# Patient Record
Sex: Female | Born: 1980 | Race: White | Hispanic: No | Marital: Married | Smoking: Former smoker
Health system: Southern US, Community
[De-identification: ages and names within clinical notes are randomized; demographics above are authoritative.]

## PROBLEM LIST (undated history)

## (undated) DIAGNOSIS — F419 Anxiety disorder, unspecified: Secondary | ICD-10-CM

## (undated) DIAGNOSIS — Z973 Presence of spectacles and contact lenses: Secondary | ICD-10-CM

## (undated) DIAGNOSIS — O039 Complete or unspecified spontaneous abortion without complication: Secondary | ICD-10-CM

## (undated) DIAGNOSIS — R350 Frequency of micturition: Secondary | ICD-10-CM

## (undated) DIAGNOSIS — E282 Polycystic ovarian syndrome: Secondary | ICD-10-CM

## (undated) DIAGNOSIS — K219 Gastro-esophageal reflux disease without esophagitis: Secondary | ICD-10-CM

## (undated) DIAGNOSIS — F17200 Nicotine dependence, unspecified, uncomplicated: Secondary | ICD-10-CM

## (undated) DIAGNOSIS — F329 Major depressive disorder, single episode, unspecified: Secondary | ICD-10-CM

## (undated) DIAGNOSIS — D259 Leiomyoma of uterus, unspecified: Secondary | ICD-10-CM

## (undated) DIAGNOSIS — E039 Hypothyroidism, unspecified: Secondary | ICD-10-CM

## (undated) DIAGNOSIS — E669 Obesity, unspecified: Secondary | ICD-10-CM

## (undated) DIAGNOSIS — O24419 Gestational diabetes mellitus in pregnancy, unspecified control: Secondary | ICD-10-CM

## (undated) DIAGNOSIS — G8929 Other chronic pain: Secondary | ICD-10-CM

## (undated) DIAGNOSIS — M542 Cervicalgia: Secondary | ICD-10-CM

## (undated) DIAGNOSIS — F411 Generalized anxiety disorder: Secondary | ICD-10-CM

## (undated) DIAGNOSIS — Z8742 Personal history of other diseases of the female genital tract: Secondary | ICD-10-CM

## (undated) DIAGNOSIS — R002 Palpitations: Secondary | ICD-10-CM

## (undated) DIAGNOSIS — N979 Female infertility, unspecified: Secondary | ICD-10-CM

## (undated) DIAGNOSIS — G44229 Chronic tension-type headache, not intractable: Secondary | ICD-10-CM

## (undated) HISTORY — DX: Polycystic ovarian syndrome: E28.2

## (undated) HISTORY — DX: Major depressive disorder, single episode, unspecified: F32.9

## (undated) HISTORY — DX: Cervicalgia: M54.2

## (undated) HISTORY — DX: Nicotine dependence, unspecified, uncomplicated: F17.200

## (undated) HISTORY — DX: Other chronic pain: G89.29

## (undated) HISTORY — DX: Anxiety disorder, unspecified: F41.9

## (undated) HISTORY — DX: Gastro-esophageal reflux disease without esophagitis: K21.9

## (undated) HISTORY — DX: Complete or unspecified spontaneous abortion without complication: O03.9

## (undated) HISTORY — DX: Obesity, unspecified: E66.9

## (undated) HISTORY — DX: Palpitations: R00.2

## (undated) HISTORY — DX: Personal history of other diseases of the female genital tract: Z87.42

## (undated) HISTORY — DX: Chronic tension-type headache, not intractable: G44.229

## (undated) HISTORY — PX: OVARIAN CYST REMOVAL: SHX89

---

## 2011-11-01 ENCOUNTER — Encounter: Payer: Self-pay | Admitting: Family Medicine

## 2011-11-01 ENCOUNTER — Ambulatory Visit (INDEPENDENT_AMBULATORY_CARE_PROVIDER_SITE_OTHER): Payer: BC Managed Care – PPO | Admitting: Family Medicine

## 2011-11-01 VITALS — BP 109/71 | HR 71 | Temp 97.2°F | Ht 67.0 in | Wt 293.0 lb

## 2011-11-01 DIAGNOSIS — T148XXA Other injury of unspecified body region, initial encounter: Secondary | ICD-10-CM | POA: Insufficient documentation

## 2011-11-01 DIAGNOSIS — IMO0002 Reserved for concepts with insufficient information to code with codable children: Secondary | ICD-10-CM

## 2011-11-01 DIAGNOSIS — S8390XA Sprain of unspecified site of unspecified knee, initial encounter: Secondary | ICD-10-CM | POA: Insufficient documentation

## 2011-11-01 MED ORDER — HYDROCODONE-ACETAMINOPHEN 5-500 MG PO TABS: 1.0000 | ORAL_TABLET | Freq: Four times a day (QID) | ORAL | Status: AC | PRN

## 2011-11-01 NOTE — Progress Notes (Signed)
Office Note 11/01/2011  CC:  Chief Complaint  Patient presents with  . Establish Care    knee pain, injured at beach on 7/24, using mom's pain med ?hydrocodone    HPI:  Carrie Blanchard is a 31 y.o. White female who is here to establish care and discuss left knee problem. Patient's most recent primary MD: Dr. Dewain Penning. Old records were not reviewed prior to or during today's visit.  Patient was at the beach 5 d/a, felt leg cramp as she got up out of beach lounger, fell and hit her leg on the chair. Felt severe pain in left leg but could bear wt initially, limped.  Then it felt much better and she was able to walk some and went to get in the hot-tub.  Pretty soon she began to feel worsening pain in left knee and it swelled some.  Some bruising was noted below knee.  She applied ice, elevated the leg, has taken some ibuprofen and/or alleve, and then when she got home from the beach 2 d/a she began taking some of her mom's hydrocodone.  The knee is no longer swollen, she rates the pain as 6/10 w/out hydrocodone, and it comes down to 3/10 with hydrocodone.  Walks with a limp. No prior hx of any knee problems.  Past Medical History  Diagnosis Date  . Polycystic ovarian syndrome   . Anxiety   . Tobacco dependence   . Obesity, Class II, BMI 35-39.9     Past Surgical History  Procedure Date  . Ovarian cyst removal Age 89 yrs    Large  (Grapefruit size per pt)    Family History  Problem Relation Age of Onset  . Arthritis Mother     "rheumatoid" per pt report  . Diabetes Father   . Cancer Maternal Grandmother     breast    History   Social History  . Marital Status: Married    Spouse Name: N/A    Number of Children: N/A  . Years of Education: N/A   Occupational History  . Not on file.   Social History Main Topics  . Smoking status: Current Everyday Smoker -- 0.5 packs/day    Types: Cigarettes  . Smokeless tobacco: Never Used  . Alcohol Use: Yes  . Drug Use: No    . Sexually Active: Not on file   Other Topics Concern  . 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  Occasional alcohol use.  No drug use.No exercise.    Outpatient Encounter Prescriptions as of 11/01/2011  Medication Sig Dispense Refill  . busPIRone (BUSPAR) 15 MG tablet Take 7.5 mg by mouth 2 (two) times daily.      . clonazePAM (KLONOPIN) 1 MG tablet Take 1 mg by mouth 2 (two) times daily as needed.      Marland Kitchen HYDROcodone-acetaminophen (VICODIN) 5-500 MG per tablet Take 1 tablet by mouth every 6 (six) hours as needed for pain. 1-2 tabs q6h prn pain  30 tablet  0  . metFORMIN (GLUCOPHAGE) 500 MG tablet Take 500 mg by mouth 2 (two) times daily with a meal.      . spironolactone (ALDACTONE) 50 MG tablet Take 50 mg by mouth daily.        No Known Allergies  ROS Review of Systems  Constitutional: Negative for fever and fatigue.  HENT: Negative for congestion and sore throat.   Eyes: Negative for visual disturbance.  Respiratory: Negative for cough.   Cardiovascular: Negative for chest pain.  Gastrointestinal: Negative for nausea and abdominal pain.  Genitourinary: Negative for dysuria.  Musculoskeletal: Negative for myalgias and back pain.  Skin: Negative for rash.  Neurological: Negative for weakness and headaches.  Hematological: Negative for adenopathy.    PE; Blood pressure 109/71, pulse 71, temperature 97.2 F (36.2 C), temperature source Temporal, height 5\' 7"  (1.702 m), weight 293 lb (132.904 kg), last menstrual period 10/02/2011, SpO2 99.00%. Gen: Alert, well appearing.  Patient is oriented to person, place, time, and situation. ENT: Eyes: no injection, icteris, swelling, or exudate.  EOMI, PERRLA. Nose: no drainage or turbinate edema/swelling.  No injection or focal lesion.  Mouth: lips without  lesion/swelling.  Oral mucosa pink and moist.  Dentition intact and without obvious caries or gingival swelling.  Oropharynx without erythema, exudate, or swelling.  Neck - No masses or thyromegaly or limitation in range of motion CV: RRR, no m/r/g.   LUNGS: CTA bilat, nonlabored resps, good aeration in all lung fields. EXT: no c/c/e.  No calf or thigh tenderness. Left leg has small bruise visible on anterior tibia--proximal, just beyond the insertion of the patellar tendon. No knee effusion, warmth, or erythema.  She can extend the knee fully, but with passive flexion she can only get to 90 degrees due to pain.  Lachman's negative.  Posterior drawer negative.  Mild discomfort in both the medial and lateral collateral ligament areas but no laxity detected.  No patellar or joint line tenderness.  No tenderness of the patellar tendon or the distal quad region.  She has moderate tenderness just distal to the insertion of the patellar tendon on the tibia, where the bruising is noted.  Pertinent labs:  none  ASSESSMENT AND PLAN:   New pt: obtain old records.  Knee sprain Improving over the last 5d. Continue RICE therapy and I did give her a elastic knee sleeve today to wear as much as possible for stability. She may take vicodin 5/500 in addition to her NSAID, 1-2 tabs q6h prn, #30, no RF.  Contusion Anterior tibia.  I think this may be giving her more pain than her knee sprain. It is improving.  I reassured her, told her to continue RICE therapy, take vicodin as noted in problem #1. F/u 1 wk.    Return in about 1 week (around 11/08/2011) for f/u left knee sprain and tibial contusions.

## 2011-11-01 NOTE — Assessment & Plan Note (Signed)
Improving over the last 5d. Continue RICE therapy and I did give her a elastic knee sleeve today to wear as much as possible for stability. She may take vicodin 5/500 in addition to her NSAID, 1-2 tabs q6h prn, #30, no RF.

## 2011-11-01 NOTE — Patient Instructions (Signed)
Continue to take ibuprofen or alleve, 2 tabs every 6 hours with food.

## 2011-11-01 NOTE — Assessment & Plan Note (Signed)
Anterior tibia.  I think this may be giving her more pain than her knee sprain. It is improving.  I reassured her, told her to continue RICE therapy, take vicodin as noted in problem #1. F/u 1 wk.

## 2011-11-04 ENCOUNTER — Telehealth: Payer: Self-pay | Admitting: Family Medicine

## 2011-11-04 NOTE — Telephone Encounter (Signed)
I have attempted to contact this patient by phone with the following results: no answer after 10 rings.   I will try again later.

## 2011-11-04 NOTE — Telephone Encounter (Signed)
2nd call from patient.  She has been unreachable and will be unreachable again soon.  Pt is requesting phenergan for nausea due to hydrocodone.  Please advise.

## 2011-11-08 ENCOUNTER — Ambulatory Visit: Payer: BC Managed Care – PPO | Admitting: Family Medicine

## 2012-01-11 ENCOUNTER — Ambulatory Visit (INDEPENDENT_AMBULATORY_CARE_PROVIDER_SITE_OTHER): Payer: BC Managed Care – PPO | Admitting: Family Medicine

## 2012-01-11 VITALS — BP 111/68 | HR 82 | Temp 98.6°F | Resp 20 | Ht 66.5 in | Wt 269.0 lb

## 2012-01-11 DIAGNOSIS — R238 Other skin changes: Secondary | ICD-10-CM | POA: Insufficient documentation

## 2012-01-11 DIAGNOSIS — R002 Palpitations: Secondary | ICD-10-CM | POA: Insufficient documentation

## 2012-01-11 DIAGNOSIS — Z8679 Personal history of other diseases of the circulatory system: Secondary | ICD-10-CM

## 2012-01-11 HISTORY — DX: Personal history of other diseases of the circulatory system: Z86.79

## 2012-01-11 HISTORY — DX: Palpitations: R00.2

## 2012-01-11 MED ORDER — MUPIROCIN CALCIUM 2 % EX CREA: TOPICAL_CREAM | Freq: Three times a day (TID) | CUTANEOUS | Status: DC

## 2012-01-11 NOTE — Progress Notes (Signed)
Carrie Blanchard is a 31 y.o. female who presents to [facility] today for  1) palpitations occurring over the past few days. Appear to be intermittent independent of anxiety or activity. She feels that her heart is fluttering. She denies any chest pains or palpitations. She has not tried any new medications recently. She feels well otherwise. Drinks one cup of coffee in the morning.   2) patient notes crusty blood from her belly button started yesterday. She denies any trauma or recent surgery to the area.  She denies any pain fevers chills sweats or weight loss.    PMH: Reviewed significant for obesity and anxiety and polycystic ovarian syndrome History  Substance Use Topics  . Smoking status: Current Every Day Smoker -- 0.5 packs/day    Types: Cigarettes  . Smokeless tobacco: Never Used  . Alcohol Use: Yes   ROS as above  Medications reviewed. Current Outpatient Prescriptions  Medication Sig Dispense Refill  . busPIRone (BUSPAR) 15 MG tablet Take 7.5 mg by mouth 2 (two) times daily.      . clonazePAM (KLONOPIN) 1 MG tablet Take 1 mg by mouth 2 (two) times daily as needed.      . metFORMIN (GLUCOPHAGE) 500 MG tablet Take 500 mg by mouth 2 (two) times daily with a meal.      . spironolactone (ALDACTONE) 50 MG tablet Take 50 mg by mouth daily.        Exam:  BP 111/68  Pulse 82  Temp 98.6 F (37 C) (Oral)  Resp 20  Ht 5' 6.5" (1.689 m)  Wt 269 lb (122.018 kg)  BMI 42.77 kg/m2  SpO2 99%  LMP 01/01/2012 Gen: Well NAD HEENT: EOMI,  MMM Lungs: CTABL Nl WOB Heart: RRR no MRG Abd: NABS, NT, ND Exts: Non edematous BL  LE, warm and well perfused.  Skin: Erythematous appearing umbilicus nontender. Small amount of serous sanguinous fluid on the probe of umbilicus with Q-tip.  Nontender.  No surrounding erythema.    EKG:  Normal sinus rhythm:  Normal rate and rhythm.  No ST segment changes Normal EKG  No results found for this or any previous visit (from the past 72 hour(s)).  Assessment  and Plan: 31 y.o. female with  1) palpitations: Likely related to anxiety versus PACs with caffeine.   Plan: Stop caffeine. Basic metabolic panel and TSH Follow up if not improved in a week or 2.  Would obtain Holter monitor at that point 2) umbilicus: Due to skin irritation secondary to body habitus. Plan: Bactroban ointment.  Follow up if worsening  Discussed warning signs or symptoms. Please see discharge instructions. Patient expresses understanding.

## 2012-01-11 NOTE — Patient Instructions (Addendum)
Thank you for coming in today. Apply the cream to your belly button twice a day.  Cut back on caffeine.  Come back when you get back if you do not get better Call or go to the emergency room if you get worse, have trouble breathing, have chest pains, or palpitations.

## 2012-01-12 ENCOUNTER — Other Ambulatory Visit: Payer: Self-pay | Admitting: Family Medicine

## 2012-01-12 LAB — BASIC METABOLIC PANEL
CO2: 29 mEq/L (ref 19–32)
Chloride: 102 mEq/L (ref 96–112)
Glucose, Bld: 73 mg/dL (ref 70–99)
Potassium: 4.5 mEq/L (ref 3.5–5.3)
Sodium: 140 mEq/L (ref 135–145)

## 2012-01-12 NOTE — Progress Notes (Signed)
patient discussed with Dr. Corey. Agree with assessment and plan of care per his note.   

## 2012-01-12 NOTE — Telephone Encounter (Signed)
Please pull chart.

## 2012-01-12 NOTE — Telephone Encounter (Signed)
Patient is a new patient and has only been seen in Epic.  No paper chart available.

## 2012-01-14 ENCOUNTER — Encounter: Payer: Self-pay | Admitting: *Deleted

## 2012-04-17 ENCOUNTER — Ambulatory Visit (INDEPENDENT_AMBULATORY_CARE_PROVIDER_SITE_OTHER): Payer: BC Managed Care – PPO | Admitting: Family Medicine

## 2012-04-17 ENCOUNTER — Encounter: Payer: Self-pay | Admitting: Family Medicine

## 2012-04-17 VITALS — BP 116/76 | HR 67 | Temp 99.0°F | Ht 67.0 in | Wt 272.0 lb

## 2012-04-17 DIAGNOSIS — M79609 Pain in unspecified limb: Secondary | ICD-10-CM

## 2012-04-17 DIAGNOSIS — M79662 Pain in left lower leg: Secondary | ICD-10-CM | POA: Insufficient documentation

## 2012-04-17 LAB — BASIC METABOLIC PANEL
BUN: 13 mg/dL (ref 6–23)
CO2: 29 mEq/L (ref 19–32)
Chloride: 103 mEq/L (ref 96–112)
Creatinine, Ser: 0.5 mg/dL (ref 0.4–1.2)

## 2012-04-17 MED ORDER — CLONAZEPAM 1 MG PO TABS: 1.0000 mg | ORAL_TABLET | Freq: Two times a day (BID) | ORAL | Status: DC | PRN

## 2012-04-17 MED ORDER — BUSPIRONE HCL 15 MG PO TABS: 7.5000 mg | ORAL_TABLET | Freq: Two times a day (BID) | ORAL | Status: DC

## 2012-04-17 MED ORDER — SPIRONOLACTONE 50 MG PO TABS: 50.0000 mg | ORAL_TABLET | Freq: Every day | ORAL | Status: DC

## 2012-04-17 MED ORDER — METFORMIN HCL 500 MG PO TABS: 500.0000 mg | ORAL_TABLET | Freq: Two times a day (BID) | ORAL | Status: DC

## 2012-04-17 NOTE — Assessment & Plan Note (Signed)
I cannot detect any abnormality. Low risk for DVT.  Will check D-dimer and BMET today. Reassured pt. No new meds rx'd today.

## 2012-04-17 NOTE — Progress Notes (Signed)
OFFICE NOTE  04/17/2012  CC:  Chief Complaint  Patient presents with  . Leg Pain    left lower leg-upper calf began yesterday; doesn't feel muscular; no redness, swelling, not warm to touch; using ibuprofen which helps; fell last week and has bruise/scratch on shin     HPI: Patient is a 32 y.o. Caucasian female who is here for onset of left calf pain 3 d/a.  The pain comes and goes, lasts  hours when it comes.  She feels no abnormality there with her hand.  Describes injury to anterior left lower leg about 1 week ago when coming down her stairs--low impact.  This left a bruise and the pain went away and it hasn't bothered her since. Calf pain not clearly connected to any trigger such as walking or wt bearing.  She is not on OCPs. No personal history of DVT or PE.  Maternal aunt had a blood clot that pt doesn't know details of. Patient does smoke about 1/3 pack of cigarettes per day.   Pertinent PMH:  Past Medical History  Diagnosis Date  . Polycystic ovarian syndrome   . Anxiety     Sees a Psychiatrist (Dr. Kateri Plummer)  . Tobacco dependence   . Obesity, Class II, BMI 35-39.9     MEDS:  Outpatient Prescriptions Prior to Visit  Medication Sig Dispense Refill  . busPIRone (BUSPAR) 15 MG tablet Take 7.5 mg by mouth 2 (two) times daily.      . clonazePAM (KLONOPIN) 1 MG tablet Take 1 mg by mouth 2 (two) times daily as needed.      . metFORMIN (GLUCOPHAGE) 500 MG tablet Take 500 mg by mouth 2 (two) times daily with a meal.      . spironolactone (ALDACTONE) 50 MG tablet Take 50 mg by mouth daily.      . [DISCONTINUED] mupirocin cream (BACTROBAN) 2 % Apply topically 3 (three) times daily.  15 g  0   Last reviewed on 04/17/2012  1:32 PM by Luisa Dago, CMA  PE: Blood pressure 116/76, pulse 67, temperature 99 F (37.2 C), temperature source Temporal, height 5\' 7"  (1.702 m), weight 272 lb (123.378 kg), SpO2 97.00%. Gen: Alert, well appearing, obese white female in NAD.  Patient is  oriented to person, place, time, and situation. AFFECT: pleasant, lucid thought and speech. LEGS: thighs nontender.  Popliteal fossae and knees without tenderness, ROM intact. Mild TTP at the site of a healing abrasion on left anterior tibial surface.  No tenderness in the remainder of her lower leg on the left.  Right lower leg without tenderness or swelling.  No edema on either side.  She has a few scattered spider veins on both LE's.   Right calf circumference at 12 cm below the patella is 43 cm. Left calf circumference at 12 cm below the patella is 42.5 cm   IMPRESSION AND PLAN:  Pain of left lower leg I cannot detect any abnormality. Low risk for DVT.  Will check D-dimer and BMET today. Reassured pt. No new meds rx'd today.  She is overdue for f/u with her psychiatrist, Dr. Kateri Plummer, and says she plans on getting f/u set up soon.  In the meantime she ran out of buspar and clonazepam 2 wks ago per her report today.  I agreed to give her 1 mo supply of each of these meds and encouraged her to f/u with psychiatrist for ongoing med management and counseling for her anxiety.  An After Visit Summary was  printed and given to the patient.  FOLLOW UP:  33mo for routine f/u PCOS meds

## 2012-07-14 ENCOUNTER — Ambulatory Visit: Payer: BC Managed Care – PPO | Admitting: Family Medicine

## 2012-09-21 ENCOUNTER — Telehealth: Payer: Self-pay | Admitting: Family Medicine

## 2012-09-21 NOTE — Telephone Encounter (Signed)
Pt's pharmacy sent a request for sertraline 50mg  PO QD but I can not find this medication in patients chart anywhere.  Please advise.

## 2012-09-21 NOTE — Telephone Encounter (Signed)
She was seeing a psychiatrist named Dr. Kateri Plummer, so he/she may have been prescribing this med for her.  I recommend she call her psychiatrist for RFs or make appt with me for f/u if she is no longer going to see her psychiatrist.  --thx

## 2012-09-22 NOTE — Telephone Encounter (Signed)
lmom for pt to return call. °

## 2012-09-25 NOTE — Telephone Encounter (Signed)
lmom for pt to return call. °

## 2012-10-02 ENCOUNTER — Other Ambulatory Visit: Payer: Self-pay | Admitting: Family Medicine

## 2012-10-02 NOTE — Telephone Encounter (Signed)
Got another request for sertraline.  Faxed to pharmacy that patient gets that from Dr. Kateri Plummer.

## 2013-02-02 ENCOUNTER — Ambulatory Visit (INDEPENDENT_AMBULATORY_CARE_PROVIDER_SITE_OTHER): Payer: BC Managed Care – PPO | Admitting: Family Medicine

## 2013-02-02 ENCOUNTER — Encounter: Payer: Self-pay | Admitting: Family Medicine

## 2013-02-02 VITALS — BP 119/85 | HR 86 | Temp 98.6°F | Resp 18 | Ht 67.0 in | Wt 266.0 lb

## 2013-02-02 DIAGNOSIS — E282 Polycystic ovarian syndrome: Secondary | ICD-10-CM

## 2013-02-02 DIAGNOSIS — F329 Major depressive disorder, single episode, unspecified: Secondary | ICD-10-CM

## 2013-02-02 DIAGNOSIS — F32A Depression, unspecified: Secondary | ICD-10-CM

## 2013-02-02 DIAGNOSIS — F341 Dysthymic disorder: Secondary | ICD-10-CM

## 2013-02-02 MED ORDER — METFORMIN HCL 500 MG PO TABS: 500.0000 mg | ORAL_TABLET | Freq: Two times a day (BID) | ORAL | Status: DC

## 2013-02-02 MED ORDER — SPIRONOLACTONE 50 MG PO TABS: 50.0000 mg | ORAL_TABLET | Freq: Every day | ORAL | Status: DC

## 2013-02-02 MED ORDER — CITALOPRAM HYDROBROMIDE 20 MG PO TABS: 20.0000 mg | ORAL_TABLET | Freq: Every day | ORAL | Status: DC

## 2013-02-02 NOTE — Progress Notes (Addendum)
OFFICE NOTE  02/09/2013  CC:  Chief Complaint  Patient presents with  . Medication Refill     HPI: Patient is a 32 y.o. Caucasian female who is here for discuss meds. Her psychiatrist, Dr. Arminda Resides, relocated/moved and pt has not found replacement. She is interested in me managing her meds at this point.  She was on clonazepam and buspar and ran out of these about 1 mo ago. She took clonaz 1 tab bid, buspar "2 tabs 2-3 times a day" but then says "sometimes I only took a 1/2 tab b/c of shakiness".  She split from her husband recently, he had an anger control problem. She describes anhedonia, poor concentration, flat emotion, irritable.  No restlessness.  Energy level ok.  +Uptight, "hard to calm down". "I can't relax".  Sleep is normal.  No crying spells.   Denies hx of manic sx's in the past.  Has been on antidepressant in the past; recalls wellbutrin, paxil, lexapro.  PCOS:  Has been out of metformin and aldactone for at least 2 mo.  She sees GYN MD --[facility], but last f/u the provider who saw her was "not able to RF my meds".  She does feel that her acne is coming back since being off the meds.   Pertinent PMH:  Past Medical History  Diagnosis Date  . Polycystic ovarian syndrome   . Anxiety     Saw a Psychiatrist (Dr. Kateri Plummer) but not as of summer 2014.  . Tobacco dependence   . Obesity, Class II, BMI 35-39.9   . Palpitations 01/11/2012   Past Surgical History  Procedure Laterality Date  . Ovarian cyst removal  Age 54 yrs    Large  (Grapefruit size per pt)   History   Social History Narrative   Married, no children.   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   BS/BA from [employer] in [employer].   Tobacco 5 pack-yr history.  Occasional alcohol use.  No drug use.   No exercise.    MEDS:  Outpatient Prescriptions Prior to Visit  Medication Sig Dispense Refill  . busPIRone (BUSPAR) 15 MG tablet Take 0.5 tablets  (7.5 mg total) by mouth 2 (two) times daily.  30 tablet  0  . clonazePAM (KLONOPIN) 1 MG tablet Take 1 tablet (1 mg total) by mouth 2 (two) times daily as needed.  60 tablet  0  . metFORMIN (GLUCOPHAGE) 500 MG tablet Take 1 tablet (500 mg total) by mouth 2 (two) times daily with a meal.  60 tablet  6  . spironolactone (ALDACTONE) 50 MG tablet Take 1 tablet (50 mg total) by mouth daily.  30 tablet  6   No facility-administered medications prior to visit.    PE: Blood pressure 119/85, pulse 86, temperature 98.6 F (37 C), temperature source Temporal, resp. rate 18, height 5\' 7"  (1.702 m), weight 266 lb (120.657 kg), last menstrual period 01/30/2013, SpO2 99.00%. Wt Readings from Last 2 Encounters:  02/02/13 266 lb (120.657 kg)  04/17/12 272 lb (123.378 kg)    Gen: alert, oriented x 4, affect pleasant.  Lucid thinking and conversation noted. HEENT: PERRLA, EOMI.   Neck: no LAD, mass, or thyromegaly. CV: RRR, no m/r/g LUNGS: CTA bilat, nonlabored. NEURO: no tremor or tics noted on observation.  Coordination intact. CN 2-12 grossly intact bilaterally, strength 5/5 in all extremeties.  No ataxia.  LABS: none today    Chemistry  Component Value Date/Time   NA 138 04/17/2012 1359   K 4.2 04/17/2012 1359   CL 103 04/17/2012 1359   CO2 29 04/17/2012 1359   BUN 13 04/17/2012 1359   CREATININE 0.5 04/17/2012 1359   CREATININE 0.65 01/11/2012 2051      Component Value Date/Time   CALCIUM 8.6 04/17/2012 1359     IMPRESSION AND PLAN:  Anxiety and depression Start trial of citalopram 20mg  qd. Therapeutic expectations and side effect profile of medication discussed today.  Patient's questions answered. No benzo or buspar rx'd at this time.   PCOS (polycystic ovarian syndrome) Continue current dosing of aldactone and metformin---RF'd meds today.  An After Visit Summary was printed and given to the patient.  FOLLOW UP: 1 mo

## 2013-02-03 DIAGNOSIS — F329 Major depressive disorder, single episode, unspecified: Secondary | ICD-10-CM

## 2013-02-03 HISTORY — DX: Major depressive disorder, single episode, unspecified: F32.9

## 2013-02-09 ENCOUNTER — Encounter: Payer: Self-pay | Admitting: Family Medicine

## 2013-02-09 DIAGNOSIS — E282 Polycystic ovarian syndrome: Secondary | ICD-10-CM | POA: Insufficient documentation

## 2013-02-09 DIAGNOSIS — F32A Depression, unspecified: Secondary | ICD-10-CM | POA: Insufficient documentation

## 2013-02-09 DIAGNOSIS — F329 Major depressive disorder, single episode, unspecified: Secondary | ICD-10-CM | POA: Insufficient documentation

## 2013-02-09 NOTE — Assessment & Plan Note (Addendum)
Start trial of citalopram 20mg  qd. Therapeutic expectations and side effect profile of medication discussed today.  Patient's questions answered. No benzo or buspar rx'd at this time.

## 2013-02-09 NOTE — Assessment & Plan Note (Signed)
Continue current dosing of aldactone and metformin---RF'd meds today.

## 2013-04-06 ENCOUNTER — Encounter: Payer: Self-pay | Admitting: Family Medicine

## 2013-04-06 ENCOUNTER — Ambulatory Visit (INDEPENDENT_AMBULATORY_CARE_PROVIDER_SITE_OTHER): Payer: BC Managed Care – PPO | Admitting: Family Medicine

## 2013-04-06 VITALS — BP 118/80 | HR 79 | Temp 98.6°F | Resp 18 | Ht 67.0 in | Wt 269.0 lb

## 2013-04-06 DIAGNOSIS — F419 Anxiety disorder, unspecified: Secondary | ICD-10-CM

## 2013-04-06 DIAGNOSIS — R51 Headache: Secondary | ICD-10-CM

## 2013-04-06 DIAGNOSIS — R519 Headache, unspecified: Secondary | ICD-10-CM

## 2013-04-06 DIAGNOSIS — F32A Depression, unspecified: Secondary | ICD-10-CM

## 2013-04-06 DIAGNOSIS — F341 Dysthymic disorder: Secondary | ICD-10-CM

## 2013-04-06 DIAGNOSIS — F329 Major depressive disorder, single episode, unspecified: Secondary | ICD-10-CM

## 2013-04-06 DIAGNOSIS — E282 Polycystic ovarian syndrome: Secondary | ICD-10-CM

## 2013-04-06 MED ORDER — CLONAZEPAM 1 MG PO TABS: 1.0000 mg | ORAL_TABLET | Freq: Two times a day (BID) | ORAL | Status: DC | PRN

## 2013-04-06 MED ORDER — SPIRONOLACTONE 50 MG PO TABS: 50.0000 mg | ORAL_TABLET | Freq: Every day | ORAL | Status: DC

## 2013-04-06 MED ORDER — ELETRIPTAN HYDROBROMIDE 40 MG PO TABS: ORAL_TABLET | ORAL | Status: DC

## 2013-04-06 MED ORDER — METFORMIN HCL 500 MG PO TABS: 500.0000 mg | ORAL_TABLET | Freq: Two times a day (BID) | ORAL | Status: DC

## 2013-04-06 MED ORDER — CITALOPRAM HYDROBROMIDE 20 MG PO TABS: 20.0000 mg | ORAL_TABLET | Freq: Every day | ORAL | Status: DC

## 2013-04-06 NOTE — Progress Notes (Signed)
OFFICE NOTE  04/06/2013  CC:  Chief Complaint  Patient presents with  . Headache  . Medication Refill     HPI: Patient is a 33 y.o. Caucasian female who is here for 2 mo f/u anxiety and depression. Started citalopram 20mg  qd last visit.   It has helped a lot with the depressed mood but it has not helped her chronic worry, irritability, inability to relax.  She has been on clonazepam in the past and has not been on this lately.  Past couple of weeks, wakes up with headaches in the morning, occipital region extending down to back of neck, moderate intensity, sometimes pulsating/throbbing, constant, using ibuprofen at least 2 times per day (400-600 mg).  No vision complaints.  Some nausea but no vomiting.  Lights and sounds do not make HAs worse.  "Feels like it is there all the time".  No dizziness.  BP check at a relative's home was normal.  No new bed or pillow. No past problem with HA syndrome at all.  Pertinent PMH:  Past Medical History  Diagnosis Date  . Polycystic ovarian syndrome   . Anxiety     Saw a Psychiatrist (Dr. Orland Mustard) but not as of summer 2014.  . Tobacco dependence   . Obesity, Class II, BMI 35-39.9   . Palpitations 01/11/2012  . 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 citalopram (CELEXA) 20 MG tablet Take 1 tablet (20 mg total) by mouth daily.  30 tablet  1  . metFORMIN (GLUCOPHAGE) 500 MG tablet Take 1 tablet (500 mg total) by mouth 2 (two) times daily with a meal.  60 tablet  6  . spironolactone (ALDACTONE) 50 MG tablet Take 1 tablet (50 mg total) by mouth daily.  30 tablet  6   No facility-administered medications prior to visit.    PE: Blood pressure 118/80, pulse 79, temperature 98.6 F (37 C), temperature source Temporal, resp. rate 18, height 5\' 7"  (1.702 m), weight 269 lb (122.018 kg), last menstrual period 03/19/2013, SpO2 99.00%. Wt Readings from Last 2 Encounters:  04/06/13 269  lb (122.018 kg)  02/02/13 266 lb (120.657 kg)    Gen: alert, oriented x 4, affect pleasant.  Lucid thinking and conversation noted. HEENT: PERRLA, EOMI.   Neck: no LAD, mass, or thyromegaly. CV: RRR, no m/r/g LUNGS: CTA bilat, nonlabored. NEURO: no tremor or tics noted on observation.  Coordination intact. CN 2-12 grossly intact bilaterally, strength 5/5 in all extremeties.  No ataxia.   IMPRESSION AND PLAN:  Recurrent occipital headache Unclear etiology. No red flags. Treat empirically as atypical migraine: relpax 40mg , 1/2-1 tab prn HA.  Therapeutic expectations and side effect profile of medication discussed today.  Patient's questions answered. Samples given today. If HAs persisting > 2wks from now + no response to triptan then will refer to neurologist.  Anxiety and depression The current medical regimen is effective;  continue present plan and medications. RFs of citalopram and clonazepam were given today.  PCOS (polycystic ovarian syndrome) The current medical regimen is effective;  continue present plan and medications. RFs of aldactone and metformin given today.    An After Visit Summary was printed and given to the patient.  FOLLOW UP: 41mo, needs routine BMET at that time

## 2013-04-06 NOTE — Progress Notes (Signed)
Pre visit review using our clinic review tool, if applicable. No additional management support is needed unless otherwise documented below in the visit note. 

## 2013-04-15 DIAGNOSIS — R51 Headache: Principal | ICD-10-CM

## 2013-04-15 DIAGNOSIS — R519 Headache, unspecified: Secondary | ICD-10-CM | POA: Insufficient documentation

## 2013-04-15 NOTE — Assessment & Plan Note (Signed)
The current medical regimen is effective;  continue present plan and medications. RFs of aldactone and metformin given today.

## 2013-04-15 NOTE — Assessment & Plan Note (Signed)
The current medical regimen is effective;  continue present plan and medications. RFs of citalopram and clonazepam were given today.

## 2013-04-15 NOTE — Assessment & Plan Note (Signed)
Unclear etiology. No red flags. Treat empirically as atypical migraine: relpax 40mg , 1/2-1 tab prn HA.  Therapeutic expectations and side effect profile of medication discussed today.  Patient's questions answered. Samples given today. If HAs persisting > 2wks from now + no response to triptan then will refer to neurologist.

## 2013-05-09 ENCOUNTER — Telehealth: Payer: Self-pay | Admitting: Family Medicine

## 2013-05-09 NOTE — Telephone Encounter (Signed)
Relevant patient education assigned to patient using Emmi. ° °

## 2013-05-16 ENCOUNTER — Ambulatory Visit: Payer: BC Managed Care – PPO | Admitting: Nurse Practitioner

## 2013-07-04 ENCOUNTER — Telehealth: Payer: Self-pay | Admitting: Family Medicine

## 2013-07-04 DIAGNOSIS — R51 Headache: Principal | ICD-10-CM

## 2013-07-04 DIAGNOSIS — R519 Headache, unspecified: Secondary | ICD-10-CM

## 2013-07-04 NOTE — Telephone Encounter (Signed)
Neuro referral ordered 

## 2013-07-04 NOTE — Telephone Encounter (Signed)
Please advise 

## 2013-07-04 NOTE — Telephone Encounter (Signed)
Patient is requesting a referral to an specialist for her migraines. She does not have a provider preference.

## 2013-07-20 ENCOUNTER — Encounter: Payer: Self-pay | Admitting: Neurology

## 2013-07-20 ENCOUNTER — Ambulatory Visit (INDEPENDENT_AMBULATORY_CARE_PROVIDER_SITE_OTHER): Payer: BC Managed Care – PPO | Admitting: Neurology

## 2013-07-20 VITALS — BP 126/78 | HR 89 | Temp 98.5°F | Ht 67.0 in | Wt 283.2 lb

## 2013-07-20 DIAGNOSIS — M542 Cervicalgia: Secondary | ICD-10-CM

## 2013-07-20 DIAGNOSIS — G44229 Chronic tension-type headache, not intractable: Secondary | ICD-10-CM

## 2013-07-20 DIAGNOSIS — G4440 Drug-induced headache, not elsewhere classified, not intractable: Secondary | ICD-10-CM

## 2013-07-20 DIAGNOSIS — G8929 Other chronic pain: Secondary | ICD-10-CM

## 2013-07-20 MED ORDER — NORTRIPTYLINE HCL 10 MG PO CAPS: 10.0000 mg | ORAL_CAPSULE | Freq: Every day | ORAL | Status: DC

## 2013-07-20 MED ORDER — TIZANIDINE HCL 2 MG PO TABS: 2.0000 mg | ORAL_TABLET | Freq: Three times a day (TID) | ORAL | Status: DC | PRN

## 2013-07-20 MED ORDER — TIZANIDINE HCL 2 MG PO TABS: 2.0000 mg | ORAL_TABLET | Freq: Four times a day (QID) | ORAL | Status: DC | PRN

## 2013-07-20 NOTE — Patient Instructions (Signed)
1.  Stop the celexa and start nortriptyline 10mg  at bedtime.  It is also an antidepressant but effective for headache.  Side effects include sleepiness, dizziness, dry mouth , weight gain.  Call in 4 eeks with update. 2.  Start tizanidine 2mg  every 8 hours as needed for neck pain.  Use sparingly.  Caution when driving as may cause drowsiness.  Call 1 week from Monday with update. 3.  Limit use of all pain relievers (including tylenol, ibuprofen) to no more than 2 days out of the week.  Stop caffeine as well. 4.  Follow up in 3 months.

## 2013-07-20 NOTE — Progress Notes (Signed)
NEUROLOGY CONSULTATION NOTE  NAYDA RIESEN MRN: [id number] DOB: 1980-05-25  Referring provider: Dr. Anitra Lauth Primary care provider: Dr. Anitra Lauth  Reason for consult:  headache  HISTORY OF PRESENT ILLNESS: Carrie Blanchard is a 33 year old left-handed woman with history of PCOS, anxiety, obesity, palpitations, tobacco use and depression who presents for headaches..  Records and images were personally reviewed where available.    Onset:  Several months ago (around July 2014).  It coincides when she started separation process from her husband. Location:  Usually bi-occipital radiating down both sides of the posterior neck and into the shoulders Quality:  Squeezing, non-throbbing pressure. Intensity:  5-6/10 (varies) Aura:  no Associated symptoms:  Sensitivity to sound.  No nausea, photophobia, osmophobia, or visual disturbance. Duration:  12-24 hours Frequency:  daily Triggers/exacerbating factors:  none Relieving factors:  Took one of her mother's muscle relaxants  Activity:  Able to function  Past abortive therapy:  Message (helped temporarily), Relpax (ineffective) Past preventative therapy:  none  Current abortive therapy:  ASA, Tylenol, Excedrin, ibuprofen (switches them up but takes daily since July 2014) Current preventative therapy:  None Other medications:  Celexa 20mg  (started in July 2014, feels it needs to be increased).  Caffeine:  1-2 cups coffee daily, 1 soda daily Depression/stress:  A little better than before but still stress and depression related to her separation. Sleep hygiene:  well Family history of headache:  Mother and father migraines No personal history of migraine.   PAST MEDICAL HISTORY: Past Medical History  Diagnosis Date  . Polycystic ovarian syndrome   . Anxiety     Saw a Psychiatrist (Dr. Orland Mustard) but not as of summer 2014.  . Tobacco dependence   . Obesity, Class II, BMI 35-39.9   . Palpitations 01/11/2012  . Depression, major 02/2013      PAST SURGICAL HISTORY: Past Surgical History  Procedure Laterality Date  . Ovarian cyst removal  Age 51 yrs    Large  (Grapefruit size per pt)    MEDICATIONS: Current Outpatient Prescriptions on File Prior to Visit  Medication Sig Dispense Refill  . clonazePAM (KLONOPIN) 1 MG tablet Take 1 tablet (1 mg total) by mouth 2 (two) times daily as needed for anxiety.  60 tablet  5  . eletriptan (RELPAX) 40 MG tablet 1/2-1 tablet by mouth at onset of headache. May repeat in 2 hours if headache persists or recurs.  3 tablet  0  . metFORMIN (GLUCOPHAGE) 500 MG tablet Take 1 tablet (500 mg total) by mouth 2 (two) times daily with a meal.  60 tablet  6  . spironolactone (ALDACTONE) 50 MG tablet Take 1 tablet (50 mg total) by mouth daily.  30 tablet  6   No current facility-administered medications on file prior to visit.    ALLERGIES: No Known Allergies  FAMILY HISTORY: Family History  Problem Relation Age of Onset  . Arthritis Mother     "rheumatoid" per pt report  . Diabetes Father   . Cancer Maternal Grandmother     breast    SOCIAL HISTORY: History   Social History  . Marital Status: Married    Spouse Name: N/A    Number of Children: N/A  . Years of Education: N/A   Occupational History  . Not on file.   Social History Main Topics  . Smoking status: Current Every Day Smoker -- 0.50 packs/day    Types: Cigarettes  . Smokeless tobacco: Never Used  . Alcohol Use: Yes  .  Drug Use: No  . Sexual Activity: Not on file   Other Topics Concern  . Not on file   Social History Narrative   Married, no children.   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   BS/BA from [employer] in [place] [employer] [employer] [facility].   Tobacco 5 pack-yr history.  Occasional alcohol use.  No drug use.   No exercise.    REVIEW OF SYSTEMS: Constitutional: No fevers, chills, or sweats, no generalized fatigue, change in appetite Eyes: No visual changes, double vision, eye  pain Ear, nose and throat: No hearing loss, ear pain, nasal congestion, sore throat Cardiovascular: No chest pain, palpitations Respiratory:  No shortness of breath at rest or with exertion, wheezes GastrointestinaI: No nausea, vomiting, diarrhea, abdominal pain, fecal incontinence Genitourinary:  No dysuria, urinary retention or frequency Musculoskeletal:  No neck pain, back pain Integumentary: No rash, pruritus, skin lesions Neurological: as above Psychiatric: No depression, insomnia, anxiety Endocrine: No palpitations, fatigue, diaphoresis, mood swings, change in appetite, change in weight, increased thirst Hematologic/Lymphatic:  No anemia, purpura, petechiae. Allergic/Immunologic: no itchy/runny eyes, nasal congestion, recent allergic reactions, rashes  PHYSICAL EXAM: Filed Vitals:   07/20/13 0915  BP: 126/78  Pulse: 89  Temp: 98.5 F (36.9 C)   General: No acute distress Head:  Normocephalic/atraumatic Neck: supple, no paraspinal tenderness, full range of motion Back: No paraspinal tenderness Heart: regular rate and rhythm Lungs: Clear to auscultation bilaterally. Vascular: No carotid bruits. Neurological Exam: Mental status: alert and oriented to person, place, and time, recent and remote memory intact, fund of knowledge intact, attention and concentration intact, speech fluent and not dysarthric, language intact. Cranial nerves: CN I: not tested CN II: pupils equal, round and reactive to light, visual fields intact, fundi unremarkable, without vessel changes, exudates, hemorrhages or papilledema. CN III, IV, VI:  full range of motion, no nystagmus, no ptosis CN V: facial sensation intact CN VII: upper and lower face symmetric CN VIII: hearing intact CN IX, X: gag intact, uvula midline CN XI: sternocleidomastoid and trapezius muscles intact CN XII: tongue midline Bulk & Tone: normal, no fasciculations. Motor: 5/5 throughout Sensation: Temperature and vibration  intact Deep Tendon Reflexes: 2+ throughout, toes downgoing Finger to nose testing: No dysmetria Heel to shin: No dysmetria Gait: Normal station and stride. Able to turn and walk in tandem. Romberg negative.  IMPRESSION: Chronic tension headache Neck pain Medication-overuse headache.  PLAN: 1.  Discontinue celexa and we will instead start nortriptyline 10mg  at bedtime, as this will address both depression and chronic headaches. Call in 4 weeks with update. 2. Will provide tizanidine 2 mg every 8 hours as needed for neck spasms area and she is to call in one week with an update. 3. limit use of all pain relievers to no more than 2 days out of the week. 4. Followup in 3 months.  Thank you for allowing me to take part in the care of this patient.  Metta Clines, DO  CC: Shawnie Dapper, MD

## 2013-07-30 ENCOUNTER — Telehealth: Payer: Self-pay | Admitting: Neurology

## 2013-07-30 NOTE — Telephone Encounter (Signed)
Patient calls stating Tizanidine 2mg  q 8 hour is not helping she is out and would like a refill with increase in dosage or a different medication .

## 2013-07-30 NOTE — Telephone Encounter (Signed)
Pt calling w/ update, how she is feeling w/ meds. CB# [phone] / Sherri S.

## 2013-07-30 NOTE — Telephone Encounter (Signed)
Increase tizanidine to 4mg  every 8 hours as needed.

## 2013-07-30 NOTE — Telephone Encounter (Signed)
Tizanidine 4 mg 1 po q 8 hrs as needed  # 30

## 2013-08-13 ENCOUNTER — Telehealth: Payer: Self-pay | Admitting: Neurology

## 2013-08-13 NOTE — Telephone Encounter (Signed)
Refill for Zanaflex 4 mg # 60 1 po 8 hours as needed NO refills called to [facility]

## 2013-08-13 NOTE — Telephone Encounter (Signed)
Pt [facility] [place]

## 2013-10-01 ENCOUNTER — Telehealth: Payer: Self-pay | Admitting: Family Medicine

## 2013-10-01 MED ORDER — CLONAZEPAM 1 MG PO TABS: 1.0000 mg | ORAL_TABLET | Freq: Two times a day (BID) | ORAL | Status: DC | PRN

## 2013-10-01 NOTE — Telephone Encounter (Signed)
Rx faxed to pharmacy. Pt aware.

## 2013-10-01 NOTE — Telephone Encounter (Signed)
Klonopin rx printed. 

## 2013-10-01 NOTE — Telephone Encounter (Signed)
Pt requesting RF of klonopin. Patient last seen 04/06/13.  Last Rx printed 04/06/13 x 5 rfs.  Please advise.

## 2013-11-16 ENCOUNTER — Other Ambulatory Visit: Payer: Self-pay | Admitting: Family Medicine

## 2013-11-27 ENCOUNTER — Other Ambulatory Visit: Payer: Self-pay | Admitting: Family Medicine

## 2013-12-04 ENCOUNTER — Encounter: Payer: Self-pay | Admitting: Family Medicine

## 2013-12-04 ENCOUNTER — Ambulatory Visit (INDEPENDENT_AMBULATORY_CARE_PROVIDER_SITE_OTHER): Payer: BC Managed Care – PPO | Admitting: Family Medicine

## 2013-12-04 VITALS — BP 123/79 | HR 89 | Temp 99.0°F | Resp 18 | Ht 67.0 in | Wt 273.0 lb

## 2013-12-04 DIAGNOSIS — N898 Other specified noninflammatory disorders of vagina: Secondary | ICD-10-CM

## 2013-12-04 DIAGNOSIS — Z7251 High risk heterosexual behavior: Secondary | ICD-10-CM

## 2013-12-04 DIAGNOSIS — R197 Diarrhea, unspecified: Secondary | ICD-10-CM

## 2013-12-04 LAB — POCT WET PREP (WET MOUNT)
CLUE CELLS WET PREP WHIFF POC: NEGATIVE
KOH WET PREP POC: POSITIVE
Trichomonas Wet Prep HPF POC: NEGATIVE
WBC WET PREP: NEGATIVE

## 2013-12-04 MED ORDER — METFORMIN HCL 500 MG PO TABS: 500.0000 mg | ORAL_TABLET | Freq: Two times a day (BID) | ORAL | Status: DC

## 2013-12-04 MED ORDER — FLUCONAZOLE 150 MG PO TABS: 150.0000 mg | ORAL_TABLET | Freq: Once | ORAL | Status: DC

## 2013-12-04 NOTE — Progress Notes (Signed)
Pre visit review using our clinic review tool, if applicable. No additional management support is needed unless otherwise documented below in the visit note. 

## 2013-12-04 NOTE — Progress Notes (Signed)
OFFICE NOTE  12/04/2013  CC:  Chief Complaint  Patient presents with  . Diarrhea    x 2 days   . Vaginal Itching    pt would like STD testing      HPI: Patient is a 33 y.o. Caucasian female who is here for diarrhea x 2 days, about 5 loose stools per day.  No abd pains, no fevers, no n/v.  +Sick contact with someone with similar sx's recently.  No dietary changes/abnormal foods.  Took imodium and it has been helpful. Also for "vaginal yeast infection".  Feels lots of vaginal itching and white discharge x 2 days.  No OTC meds tried. +Recent new sexual partner, unprotected intercourse.  LMP was about 2 wks ago.   Desires STD testing.  Partner w/out any sx's per pt.  Pertinent PMH:  Past medical, surgical, social, and family history reviewed and no changes are noted since last office visit.  MEDS:  Outpatient Prescriptions Prior to Visit  Medication Sig Dispense Refill  . clonazePAM (KLONOPIN) 1 MG tablet Take 1 tablet (1 mg total) by mouth 2 (two) times daily as needed for anxiety.  60 tablet  5  . eletriptan (RELPAX) 40 MG tablet 1/2-1 tablet by mouth at onset of headache. May repeat in 2 hours if headache persists or recurs.  3 tablet  0  . nortriptyline (PAMELOR) 10 MG capsule Take 1 capsule (10 mg total) by mouth at bedtime.  30 capsule  3  . spironolactone (ALDACTONE) 50 MG tablet TAKE 1 TABLET (50 MG TOTAL) BY MOUTH DAILY.  30 tablet  2  . metFORMIN (GLUCOPHAGE) 500 MG tablet Take 1 tablet (500 mg total) by mouth 2 (two) times daily with a meal.  60 tablet  6  . tiZANidine (ZANAFLEX) 2 MG tablet Take 1 tablet (2 mg total) by mouth every 8 (eight) hours as needed for muscle spasms.  30 tablet  0   No facility-administered medications prior to visit.    PE: Blood pressure 123/79, pulse 89, temperature 99 F (37.2 C), temperature source Temporal, resp. rate 18, height 5\' 7"  (1.702 m), weight 273 lb (123.832 kg), SpO2 98.00%. Gen: Alert, well appearing.  Patient is oriented to  person, place, time, and situation. GU: Vagina and vulva are normal;  Mild amount of moderately thick white discharge is noted in vaginal vault, without clumps.  Cervix normal without lesions.  Bimanual exam was not done.   Exam chaperoned by female assistant: Jacklynn Ganong, CMA.  IMPRESSION AND PLAN:  1) High risk sexual behavior:  Cervical GC/Chlamydia swab sent. RPR and HIV blood testing done.  2) Vaginal d/c: as noted in #1 above. Also, wet prep today:  Abundant yeast, o/w unremarkable. Diflucan 150mg  qd x 1d, RF x 1.  3) Acute diarrhea, likely viral etiology.  Reassured.  Push fluids, rest, continue OTC anti-diarrheal med prn. Signs/symptoms to call or return for were reviewed and pt expressed understanding.  An After Visit Summary was printed and given to the patient.  FOLLOW UP: prn

## 2013-12-05 LAB — GC/CHLAMYDIA PROBE AMP
CT Probe RNA: NEGATIVE
GC PROBE AMP APTIMA: NEGATIVE

## 2013-12-05 LAB — HIV ANTIBODY (ROUTINE TESTING W REFLEX): HIV 1&2 Ab, 4th Generation: NONREACTIVE

## 2013-12-05 LAB — RPR

## 2013-12-17 ENCOUNTER — Other Ambulatory Visit: Payer: Self-pay | Admitting: Family Medicine

## 2013-12-17 NOTE — Telephone Encounter (Signed)
Rx request for citalopram.  Dr Tomi Likens d/c'd medication for patient 07/20/13.  Pharmacy aware.

## 2014-01-01 ENCOUNTER — Other Ambulatory Visit: Payer: Self-pay | Admitting: Family Medicine

## 2014-01-01 NOTE — Telephone Encounter (Signed)
Duplicate request.  Patient no longer on Citalopram.   RX request denied and sent back to pharmacy.

## 2014-03-14 ENCOUNTER — Ambulatory Visit (INDEPENDENT_AMBULATORY_CARE_PROVIDER_SITE_OTHER): Payer: BC Managed Care – PPO | Admitting: Family Medicine

## 2014-03-14 ENCOUNTER — Encounter: Payer: Self-pay | Admitting: Family Medicine

## 2014-03-14 ENCOUNTER — Encounter: Payer: Self-pay | Admitting: *Deleted

## 2014-03-14 VITALS — BP 104/72 | HR 93 | Temp 98.4°F | Resp 18 | Ht 67.0 in | Wt 271.0 lb

## 2014-03-14 DIAGNOSIS — F329 Major depressive disorder, single episode, unspecified: Secondary | ICD-10-CM

## 2014-03-14 DIAGNOSIS — F32A Depression, unspecified: Secondary | ICD-10-CM

## 2014-03-14 DIAGNOSIS — F418 Other specified anxiety disorders: Secondary | ICD-10-CM

## 2014-03-14 DIAGNOSIS — J069 Acute upper respiratory infection, unspecified: Secondary | ICD-10-CM

## 2014-03-14 DIAGNOSIS — J209 Acute bronchitis, unspecified: Secondary | ICD-10-CM

## 2014-03-14 DIAGNOSIS — F419 Anxiety disorder, unspecified: Secondary | ICD-10-CM

## 2014-03-14 MED ORDER — CITALOPRAM HYDROBROMIDE 20 MG PO TABS: 20.0000 mg | ORAL_TABLET | Freq: Every day | ORAL | Status: DC

## 2014-03-14 NOTE — Patient Instructions (Signed)
Buy mucinex DM and follow instructions on packaging.  Buy generic saline nasal spray and use 2-3 sprays in each nostril several times per day to irrigate and moisturize nasal passages.

## 2014-03-14 NOTE — Progress Notes (Signed)
OFFICE NOTE  03/14/2014  CC:  Chief Complaint  Patient presents with  . Cough    x 2 days  . Fever  . Medication Refill    pt wants to go back on citalopram   HPI: Patient is a 34 y.o. Caucasian female who is here for cough and fever. Onset 2 days ago, generalized weakness, cough, runny nose, wheezing.  Subjective fever but no fever when she takes her temp. Theraflu and nyquil no help.  Has an inhaler but says it is old. She has never had a flu shot.   Pertinent PMH:  Past Medical History  Diagnosis Date  . Polycystic ovarian syndrome   . Anxiety     Saw a Psychiatrist (Dr. Orland Mustard) but not as of summer 2014.  . Tobacco dependence   . Obesity, Class II, BMI 35-39.9   . Palpitations 01/11/2012  . Depression, major 02/2013  . Chronic tension headaches     (occipital) with medication overuse component  . Chronic neck pain    Past Surgical History  Procedure Laterality Date  . Ovarian cyst removal  Age 54 yrs    Large  (Grapefruit size per pt)    MEDS:  Aldactone 50mg  qd, metformin 500 mg bid, clonazepam 1mg  bid prn  PE: Blood pressure 104/72, pulse 93, temperature 98.4 F (36.9 C), temperature source Temporal, resp. rate 18, height 5\' 7"  (1.702 m), weight 271 lb (122.925 kg), SpO2 97 %. VS: noted--normal. Gen: alert, NAD, NONTOXIC APPEARING. HEENT: eyes without injection, drainage, or swelling.  Ears: EACs clear, TMs with normal light reflex and landmarks.  Nose: Clear rhinorrhea, with some dried, crusty exudate adherent to mildly injected mucosa.  No purulent d/c.  No paranasal sinus TTP.  No facial swelling.  Throat and mouth without focal lesion.  No pharyngial swelling, erythema, or exudate.   Neck: supple, no LAD.   LUNGS: CTA bilat, nonlabored resps.  She has some transmitted upper airway noise and some pseudowheeze but no chest wheeze and no prolonged expiratory phase, no post-exhalation coughing. CV: RRR, no m/r/g. EXT: no c/c/e SKIN: no rash  IMPRESSION AND  PLAN:  1) Viral URI with mild acute bronchitis. Trial of mucinex DM or robitussin DM otc as directed on the box. May use OTC nasal saline spray or irrigation solution bid. OTC nonsedating antihistamines prn discussed.  Decongestant use discussed--ok if tolerated in the past w/out side effect and if pt has no hx of HTN.  2) Anxiety and depression: pretty stable, but she says somewhere along the line we inadvertently stopped authorizing her RF's, so she has been off of her citalopram for a while.  I restarted her 20mg  citalopram today and we'll see how she's doing on this dosing in 44mo.  3) She wanted to discuss getting on birth control today b/c she wants to use this as part of her treatment for PCOS, but we did not have adequate time to cover this problem today.  I asked her to return in 1 mo to discuss this problem exclusively (and we'll see how she's doing back on her 20mg  citalopram.  An After Visit Summary was printed and given to the patient.  FOLLOW UP: 44mo

## 2014-03-15 ENCOUNTER — Telehealth: Payer: Self-pay | Admitting: Family Medicine

## 2014-03-15 NOTE — Telephone Encounter (Signed)
Patient's employer had required her to stay out of work since she is contagious and she works with children. Please send note to excuse her for work 12/9 through 03/15/14. Please fax to [facility] Attn: Thayer Headings [phone]

## 2014-03-15 NOTE — Telephone Encounter (Signed)
[employer] fax:

## 2014-03-15 NOTE — Telephone Encounter (Signed)
Yes, this is ok.

## 2014-03-15 NOTE — Telephone Encounter (Signed)
Done and faxed

## 2014-03-15 NOTE — Telephone Encounter (Signed)
Is this Ok? Please advise

## 2014-03-28 ENCOUNTER — Other Ambulatory Visit: Payer: Self-pay | Admitting: Family Medicine

## 2014-03-28 MED ORDER — SPIRONOLACTONE 50 MG PO TABS: ORAL_TABLET | ORAL | Status: DC

## 2014-04-04 ENCOUNTER — Telehealth: Payer: Self-pay | Admitting: Family Medicine

## 2014-04-04 MED ORDER — CLONAZEPAM 1 MG PO TABS: 1.0000 mg | ORAL_TABLET | Freq: Two times a day (BID) | ORAL | Status: DC | PRN

## 2014-04-04 NOTE — Telephone Encounter (Signed)
Clonazepam rx printed. 

## 2014-04-04 NOTE — Telephone Encounter (Signed)
Rf request for clonazepam.  Last OV was 03/14/14.  Last RX printed 10/01/13 x 5 rfs.  Please advise.

## 2014-04-04 NOTE — Telephone Encounter (Signed)
rx faxed

## 2014-04-08 ENCOUNTER — Other Ambulatory Visit: Payer: Self-pay | Admitting: Family Medicine

## 2014-04-08 MED ORDER — SPIRONOLACTONE 50 MG PO TABS: ORAL_TABLET | ORAL | Status: DC

## 2014-08-05 ENCOUNTER — Telehealth: Payer: Self-pay | Admitting: Family Medicine

## 2014-08-05 NOTE — Telephone Encounter (Signed)
Rf request for metformin.  Last OV was 03/14/14.   I didn't know protocol to refill metformin for PCOS.  Please advise.

## 2014-08-07 ENCOUNTER — Encounter: Payer: Self-pay | Admitting: Family Medicine

## 2014-08-07 MED ORDER — METFORMIN HCL 500 MG PO TABS: 500.0000 mg | ORAL_TABLET | Freq: Two times a day (BID) | ORAL | Status: DC

## 2014-08-07 NOTE — Telephone Encounter (Signed)
I sent in metformin 1 mo supply with 1 additional RF. Pt is overdue for f/u. Needs f/u office visit sometime in the next 2 mo--no FURTHER RFs if no f/u during this time.

## 2014-08-07 NOTE — Telephone Encounter (Signed)
Sent unable to contact letter to patient. 

## 2014-08-07 NOTE — Telephone Encounter (Signed)
Both cell and home phones are disconnected.  Unable to reach patient.

## 2014-09-05 ENCOUNTER — Telehealth: Payer: Self-pay | Admitting: Family Medicine

## 2014-09-05 NOTE — Telephone Encounter (Signed)
Pt received letter in regards to medication refill. She was instructed that she needed an appt. before we could refill another rx. Pt stated she would call back at a later date and schedule such appt. Pt. Understood there would be no more refills until she had an appt.

## 2014-10-01 ENCOUNTER — Other Ambulatory Visit: Payer: Self-pay | Admitting: *Deleted

## 2014-10-01 MED ORDER — CITALOPRAM HYDROBROMIDE 20 MG PO TABS: 20.0000 mg | ORAL_TABLET | Freq: Every day | ORAL | Status: DC

## 2014-10-01 NOTE — Telephone Encounter (Signed)
RF request for Citalopram.  LOV: 03/14/14 over due for f/u Next ov: None Last written: 03/14/14 #30 w/ 6RF Please advise. Thanks.

## 2014-10-18 ENCOUNTER — Other Ambulatory Visit: Payer: Self-pay | Admitting: *Deleted

## 2014-10-18 MED ORDER — CLONAZEPAM 1 MG PO TABS: 1.0000 mg | ORAL_TABLET | Freq: Two times a day (BID) | ORAL | Status: DC | PRN

## 2014-10-18 NOTE — Telephone Encounter (Signed)
I'll do rx for 1 mo supply with 1 RF but pt needs to come in for f/u visit for anxiety meds sometime in the next 2 months or I can't keep [place] meds.-thx

## 2014-10-18 NOTE — Telephone Encounter (Signed)
Pt advised and voiced understanding.   

## 2014-10-18 NOTE — Telephone Encounter (Signed)
RF request for clonazepam.  LOV: 03/14/14 over due for f/u Next ov: None Last written: 04/04/14 #60 w/ 5RF Please advise. Thanks.

## 2014-11-18 ENCOUNTER — Telehealth: Payer: Self-pay | Admitting: Geriatric Medicine

## 2014-11-18 NOTE — Telephone Encounter (Signed)
Patient called and wants to know if you would refill her clonazepam without an office visit because she is due to come in. She says she really feels anxious without it.

## 2014-11-18 NOTE — Telephone Encounter (Signed)
I will do a 30d supply but she needs to come in before this runs out or I can't do any further RFs.-thx

## 2014-11-19 NOTE — Telephone Encounter (Signed)
Left message for pt to call back  °

## 2014-11-20 ENCOUNTER — Other Ambulatory Visit: Payer: Self-pay | Admitting: *Deleted

## 2014-11-20 MED ORDER — METFORMIN HCL 500 MG PO TABS: 500.0000 mg | ORAL_TABLET | Freq: Two times a day (BID) | ORAL | Status: DC

## 2014-11-20 NOTE — Telephone Encounter (Signed)
According to the EMR, I filled this on 10/18/14, #60, with 1 additional RF.  So she should have a refill left on this rx.-thx

## 2014-11-20 NOTE — Telephone Encounter (Signed)
Left detailed message on home vm, okay per DPR.  

## 2014-11-20 NOTE — Telephone Encounter (Signed)
Pt LMOM on 11/20/14 at 9:18am returning call. I called pt back and advised her of message below. Please print and sign Rx to be faxed to her pharmacy. Thanks.

## 2014-11-20 NOTE — Telephone Encounter (Signed)
RF request for metformin LOV: 03/14/14 Next ov: 11/22/14 Last written: 08/07/14 #60 w/ 1RF

## 2014-11-22 ENCOUNTER — Encounter: Payer: Self-pay | Admitting: Family Medicine

## 2014-11-22 NOTE — Progress Notes (Deleted)
OFFICE VISIT  11/22/2014   CC: No chief complaint on file.  HPI:    Patient is a 34 y.o. Caucasian female who presents for 8 mo f/u anxiety and depression.  Past Medical History  Diagnosis Date  . Polycystic ovarian syndrome   . Anxiety     Saw a Psychiatrist (Dr. Orland Mustard) but not as of summer 2014.  . Tobacco dependence   . Obesity, Class II, BMI 35-39.9   . Palpitations 01/11/2012  . Depression, major 02/2013  . Chronic tension headaches     (occipital) with medication overuse component  . Chronic neck pain     Past Surgical History  Procedure Laterality Date  . Ovarian cyst removal  Age 23 yrs    Large  (Grapefruit size per pt)    Outpatient Prescriptions Prior to Visit  Medication Sig Dispense Refill  . citalopram (CELEXA) 20 MG tablet Take 1 tablet (20 mg total) by mouth daily. 30 tablet 6  . clonazePAM (KLONOPIN) 1 MG tablet Take 1 tablet (1 mg total) by mouth 2 (two) times daily as needed for anxiety. 60 tablet 1  . metFORMIN (GLUCOPHAGE) 500 MG tablet Take 1 tablet (500 mg total) by mouth 2 (two) times daily with a meal. 60 tablet 3  . spironolactone (ALDACTONE) 50 MG tablet TAKE 1 TABLET (50 MG TOTAL) BY MOUTH DAILY. 30 tablet 3   No facility-administered medications prior to visit.    No Known Allergies  ROS As per HPI  PE: There were no vitals taken for this visit. ***  LABS:  ***  IMPRESSION AND PLAN:  No problem-specific assessment & plan notes found for this encounter.   FOLLOW UP: No Follow-up on file.

## 2014-12-19 ENCOUNTER — Other Ambulatory Visit: Payer: Self-pay | Admitting: *Deleted

## 2014-12-19 NOTE — Telephone Encounter (Signed)
RF request for clonazepam LOV: 03/14/14 Next ov: None Last written: 10/18/14 #60 w/ 1RF Please advise. Thanks.

## 2014-12-19 NOTE — Telephone Encounter (Signed)
Sorry, but pt was notified multiple times that she is overdue for appropriate follow up. Last month she was told no further RF's would be given until she followed up in office. I am denying this RF.

## 2014-12-20 NOTE — Telephone Encounter (Signed)
Pt advised and stated that she has been really busy at work. She wanted to know if she scheduled an apt would Dr. Anitra Lauth go ahead and refill her Rx. Spoke to Dr. Anitra Lauth and he stated that once she has been seen in the office he will refill her medication but not til then. Pt advised and stated that she will call back to schedule apt.

## 2014-12-20 NOTE — Telephone Encounter (Signed)
Left message for pt to call back  °

## 2015-02-17 ENCOUNTER — Other Ambulatory Visit: Payer: Self-pay | Admitting: *Deleted

## 2015-02-17 NOTE — Telephone Encounter (Signed)
RF request for spironolactone LOV: 03/14/14 Next ov: None - pt has been advised at previous refill that she will need apt for more refills.  Last written: 04/08/14 #30 w/ 3RF  Pt needs ov for more refills.

## 2015-02-17 NOTE — Telephone Encounter (Signed)
Left message for pt to call back  °

## 2015-02-18 MED ORDER — SPIRONOLACTONE 50 MG PO TABS: ORAL_TABLET | ORAL | Status: DC

## 2015-02-18 MED ORDER — CLONAZEPAM 1 MG PO TABS: 1.0000 mg | ORAL_TABLET | Freq: Two times a day (BID) | ORAL | Status: DC | PRN

## 2015-02-18 NOTE — Telephone Encounter (Signed)
Rx faxed.  Pt advised.  

## 2015-02-18 NOTE — Telephone Encounter (Signed)
Pt returned call and scheduled apt on 02/24/15 at 3:45pm. Pt requested refill. I advised her I will send in #15 w/ 0RF and she must keep apt on 02/24/15 for more refills. Pt voiced understanding.

## 2015-02-18 NOTE — Telephone Encounter (Signed)
RF request for clonazepam LOV: 03/14/14 Next ov: 02/24/15 - this apt was made today, after advising pt that no more refills for spironolactone would be sent until she was seen. I did send a 15 day supply to get her by til her apt on Monday.  Last written: 10/18/14 #60 w/ 1RF Please advise.Thanks.

## 2015-02-24 ENCOUNTER — Ambulatory Visit: Payer: Self-pay | Admitting: Family Medicine

## 2015-03-03 ENCOUNTER — Encounter: Payer: Self-pay | Admitting: Family Medicine

## 2015-03-03 ENCOUNTER — Ambulatory Visit (INDEPENDENT_AMBULATORY_CARE_PROVIDER_SITE_OTHER): Payer: BLUE CROSS/BLUE SHIELD | Admitting: Family Medicine

## 2015-03-03 ENCOUNTER — Other Ambulatory Visit: Payer: Self-pay | Admitting: *Deleted

## 2015-03-03 VITALS — BP 108/76 | HR 76 | Temp 98.8°F | Resp 16 | Ht 67.0 in | Wt 270.0 lb

## 2015-03-03 DIAGNOSIS — F411 Generalized anxiety disorder: Secondary | ICD-10-CM

## 2015-03-03 DIAGNOSIS — F329 Major depressive disorder, single episode, unspecified: Secondary | ICD-10-CM | POA: Diagnosis not present

## 2015-03-03 DIAGNOSIS — E282 Polycystic ovarian syndrome: Secondary | ICD-10-CM

## 2015-03-03 DIAGNOSIS — F32A Depression, unspecified: Secondary | ICD-10-CM

## 2015-03-03 MED ORDER — DULOXETINE HCL 60 MG PO CPEP: 60.0000 mg | ORAL_CAPSULE | Freq: Every day | ORAL | Status: DC

## 2015-03-03 MED ORDER — CLONAZEPAM 1 MG PO TABS: 1.0000 mg | ORAL_TABLET | Freq: Two times a day (BID) | ORAL | Status: DC | PRN

## 2015-03-03 MED ORDER — SPIRONOLACTONE 50 MG PO TABS: ORAL_TABLET | ORAL | Status: DC

## 2015-03-03 MED ORDER — METFORMIN HCL 500 MG PO TABS: 500.0000 mg | ORAL_TABLET | Freq: Two times a day (BID) | ORAL | Status: DC

## 2015-03-03 NOTE — Progress Notes (Signed)
OFFICE VISIT  03/03/2015   CC:  Chief Complaint  Patient presents with  . Follow-up    Pt is not fasting.    HPI:    Patient is a 34 y.o. Caucasian female who presents for 1 yr f/u: PCOS, chronic anxiety/depression. Still doing chronic worrying, having crying spells, irritable.  Feeling hopeless, forgetful/poor concentration  No SI or HI. No panic attacks.  Got divorced recently and this is a stressor but she is viewing it as a good thing. Has been in a good relationship for a year. Dep/anx has been an ongoing problem since her early teens and she has tried many meds: she can only recall wellbutrin, buspar, prozac, lexapro.  No hx of manic/hypomanic type behavior or feelings. No hx of abuse/PTSD sx's.  ROS: denies excessive thirst or urination LMP was a couple of weeks ago, these are regular. She last ate about 7 hours ago (biscuit), one cup of coffee. Has had URI sx's for the last week.  Feels like this is getting a bit better now, though.  No fevers, no wheezing/chest tightness/SOB.  Past Medical History  Diagnosis Date  . Polycystic ovarian syndrome   . Anxiety     Saw a Psychiatrist (Dr. Orland Mustard) but not as of summer 2014.  . Tobacco dependence   . Obesity, Class II, BMI 35-39.9   . Palpitations 01/11/2012  . Depression, major ([place]) 02/2013  . Chronic tension headaches     (occipital) with medication overuse component  . Chronic neck pain     Past Surgical History  Procedure Laterality Date  . Ovarian cyst removal  Age 59 yrs    Large  (Grapefruit size per pt)    Outpatient Prescriptions Prior to Visit  Medication Sig Dispense Refill  . citalopram (CELEXA) 20 MG tablet Take 1 tablet (20 mg total) by mouth daily. 30 tablet 6  . clonazePAM (KLONOPIN) 1 MG tablet Take 1 tablet (1 mg total) by mouth 2 (two) times daily as needed for anxiety. 30 tablet 0  . metFORMIN (GLUCOPHAGE) 500 MG tablet Take 1 tablet (500 mg total) by mouth 2 (two) times daily with a meal. 60  tablet 3  . spironolactone (ALDACTONE) 50 MG tablet TAKE 1 TABLET (50 MG TOTAL) BY MOUTH DAILY. 15 tablet 0   No facility-administered medications prior to visit.    No Known Allergies  ROS As per HPI  PE: Blood pressure 108/76, pulse 76, temperature 98.8 F (37.1 C), temperature source Oral, resp. rate 16, height 5\' 7"  (1.702 m), weight 270 lb (122.471 kg), SpO2 97 %. VS: noted--normal. Gen: alert, NAD, NONTOXIC APPEARING. HEENT: eyes without injection, drainage, or swelling.  Ears: EACs clear, TMs with normal light reflex and landmarks.  Nose: Clear rhinorrhea, with some dried, crusty exudate adherent to mildly injected mucosa.  No purulent d/c.  No paranasal sinus TTP.  No facial swelling.  Throat and mouth without focal lesion.  No pharyngial swelling, erythema, or exudate.   Neck: supple, no LAD.   LUNGS: CTA bilat, nonlabored resps.   CV: RRR, no m/r/g. EXT: no c/c/e SKIN: no rash  LABS:  Lab Results  Component Value Date   TSH 1.966 01/11/2012    Lab Results  Component Value Date   CREATININE 0.5 04/17/2012   BUN 13 04/17/2012   NA 138 04/17/2012   K 4.2 04/17/2012   CL 103 04/17/2012   CO2 29 04/17/2012    IMPRESSION AND PLAN:  1) Chronic depression and generalized anxiety disorder:  Change 20mg  citalopram to 60mg  duloxetine qd.  Therapeutic expectations and side effect profile of medication discussed today.  Patient's questions answered. Continue clonazepam 1mg  bid prn, #60 per month, RF x 5---rx given to pt today.  2) PCOS:  Continue current meds, check BMET and HbA1c today. Her wt has been stable over the last 1 yr.  Menstrual cycles are regular.  She declined flu vaccine today.  Spent 25 min with pt today, with >50% of this time spent in counseling and care coordination regarding the above problems.  FOLLOW UP: Return in about 4 weeks (around 03/31/2015) for f/u anxiety and depression.

## 2015-03-03 NOTE — Telephone Encounter (Signed)
RF request for spironolactone LOV: 03/14/14 Next ov: 03/03/15 Last written: 02/18/15 #15 w/ 0RF  May refill if pt keeps apt for today.

## 2015-03-03 NOTE — Progress Notes (Signed)
Pre visit review using our clinic review tool, if applicable. No additional management support is needed unless otherwise documented below in the visit note. 

## 2015-03-04 LAB — COMPREHENSIVE METABOLIC PANEL
ALT: 28 U/L (ref 0–35)
AST: 18 U/L (ref 0–37)
Albumin: 4.2 g/dL (ref 3.5–5.2)
Alkaline Phosphatase: 70 U/L (ref 39–117)
BUN: 8 mg/dL (ref 6–23)
CO2: 30 meq/L (ref 19–32)
CREATININE: 0.58 mg/dL (ref 0.40–1.20)
Calcium: 9.4 mg/dL (ref 8.4–10.5)
Chloride: 101 mEq/L (ref 96–112)
GFR: 125.88 mL/min (ref >60.00)
Glucose, Bld: 88 mg/dL (ref 70–99)
Potassium: 3.9 mEq/L (ref 3.5–5.1)
SODIUM: 139 meq/L (ref 135–145)
Total Bilirubin: 0.3 mg/dL (ref 0.2–1.2)
Total Protein: 7.4 g/dL (ref 6.0–8.3)

## 2015-03-04 LAB — HEMOGLOBIN A1C: Hgb A1c MFr Bld: 5.7 % (ref 4.6–6.5)

## 2015-03-10 ENCOUNTER — Telehealth: Payer: Self-pay | Admitting: Family Medicine

## 2015-03-10 MED ORDER — AZITHROMYCIN 250 MG PO TABS: ORAL_TABLET | ORAL | Status: DC

## 2015-03-10 MED ORDER — PREDNISONE 20 MG PO TABS: ORAL_TABLET | ORAL | Status: DC

## 2015-03-10 NOTE — Telephone Encounter (Signed)
Rx's sent as directed. Pt advised and voiced understanding.

## 2015-03-10 NOTE — Telephone Encounter (Signed)
Pls eRx azithromycin 250mg , 2 tabs po qd x 1d, then 1 tab po qd x 4d, #6, no RF. Also prednisone 20mg , 2 tabs po qd x 5d, #10, no RF.-thx

## 2015-03-10 NOTE — Telephone Encounter (Signed)
Spoke to pt and she stated that she has sore throat, ear pain, chills and a cough that is both dry and productive. Please advise. Thanks.

## 2015-03-10 NOTE — Telephone Encounter (Signed)
Patient is not better, she is out of work today. Can an Rx be sent to [facility]? Either a steroid or an antibiotic, which ever Dr. Anitra Lauth feels is best.

## 2015-04-02 ENCOUNTER — Ambulatory Visit: Payer: BLUE CROSS/BLUE SHIELD | Admitting: Family Medicine

## 2015-05-05 ENCOUNTER — Telehealth: Payer: Self-pay | Admitting: *Deleted

## 2015-05-05 MED ORDER — DULOXETINE HCL 60 MG PO CPEP: 60.0000 mg | ORAL_CAPSULE | Freq: Every day | ORAL | Status: DC

## 2015-05-05 NOTE — Telephone Encounter (Signed)
RF request for duloxetine LOV: 03/03/15 Next ov: None- No showed apt on 04/02/15 Last written: 03/03/15 #30 w/ 1RF  Rx sent for #30 w/ 0RF. Pt needs ov for more refills.

## 2015-05-05 NOTE — Telephone Encounter (Signed)
Left detailed message on home vm, okay per DPR.  

## 2015-05-06 ENCOUNTER — Ambulatory Visit: Payer: BLUE CROSS/BLUE SHIELD | Admitting: Family Medicine

## 2015-05-14 ENCOUNTER — Encounter: Payer: Self-pay | Admitting: Family Medicine

## 2015-05-14 ENCOUNTER — Ambulatory Visit (INDEPENDENT_AMBULATORY_CARE_PROVIDER_SITE_OTHER): Payer: BLUE CROSS/BLUE SHIELD | Admitting: Family Medicine

## 2015-05-14 VITALS — BP 122/78 | HR 83 | Temp 98.2°F | Resp 16 | Ht 67.0 in | Wt 270.2 lb

## 2015-05-14 DIAGNOSIS — L03116 Cellulitis of left lower limb: Secondary | ICD-10-CM

## 2015-05-14 DIAGNOSIS — S8012XA Contusion of left lower leg, initial encounter: Secondary | ICD-10-CM

## 2015-05-14 MED ORDER — CEPHALEXIN 500 MG PO CAPS: ORAL_CAPSULE | ORAL | Status: DC

## 2015-05-14 MED ORDER — HYDROCODONE-ACETAMINOPHEN 5-325 MG PO TABS: 1.0000 | ORAL_TABLET | Freq: Four times a day (QID) | ORAL | Status: DC | PRN

## 2015-05-14 NOTE — Progress Notes (Signed)
OFFICE NOTE  05/14/2015  CC:  Chief Complaint  Patient presents with  . Leg Pain    left leg injury 3 days ago   HPI: Patient is a 35 y.o. Caucasian female who is here for left leg pain. 4 days ago a large piece of furnature she was moving hit her just below the knee and involved the LL down to the ankle.  She has pain, swelling, and tightness feeling with extensive bruising on L LL anteromedially.  Ibup and alleve + ice.    Pertinent PMH:  Past medical, surgical, social, and family history reviewed and no changes are noted since last office visit.  MEDS:  Outpatient Prescriptions Prior to Visit  Medication Sig Dispense Refill  . clonazePAM (KLONOPIN) 1 MG tablet Take 1 tablet (1 mg total) by mouth 2 (two) times daily as needed for anxiety. 60 tablet 5  . DULoxetine (CYMBALTA) 60 MG capsule Take 1 capsule (60 mg total) by mouth daily. 30 capsule 0  . metFORMIN (GLUCOPHAGE) 500 MG tablet Take 1 tablet (500 mg total) by mouth 2 (two) times daily with a meal. 60 tablet 11  . spironolactone (ALDACTONE) 50 MG tablet TAKE 1 TABLET (50 MG TOTAL) BY MOUTH DAILY. 30 tablet 11  . azithromycin (ZITHROMAX Z-PAK) 250 MG tablet Take 2 tablets first day then 1 tablet daily (Patient not taking: Reported on 05/14/2015) 6 each 0  . predniSONE (DELTASONE) 20 MG tablet Take 2 tablets daily x 5 days (Patient not taking: Reported on 05/14/2015) 10 tablet 0   No facility-administered medications prior to visit.    PE: Blood pressure 122/78, pulse 83, temperature 98.2 F (36.8 C), temperature source Oral, resp. rate 16, height 5\' 7"  (1.702 m), weight 270 lb 4 oz (122.585 kg), SpO2 99 %. Gen: Alert, well appearing.  Patient is oriented to person, place, time, and situation. Left lower leg with diffuse bruising, swelling, and tenderness anteromedially down into medial ankle level---tenderness diffusely in this area.  Mild extra warmth and some erythema in the central aspect of the area of swelling.  No skin  breakdown.  IMPRESSION AND PLAN:  Left lower leg contusion: question of early signs of cellulitis vs inflammatory rxn to her contusion. Start cephalexin 500 tid x 10d. Further instructions: Elevate you leg as much as possible, rest, ice the area of swelling for 20 min per day. Take 2 alleve twice a day with food for the next 7d. Vicodin 5/325, 1-2 qhs prn severe pain, #20.  An After Visit Summary was printed and given to the patient.  FOLLOW UP: has f/u appt already scheduled for her anxiety meds

## 2015-05-14 NOTE — Patient Instructions (Signed)
Elevate you leg as much as possible, rest, ice the area of swelling for 20 min per day. Take 2 alleve twice a day with food for the next 7d.

## 2015-05-14 NOTE — Progress Notes (Signed)
Pre visit review using our clinic review tool, if applicable. No additional management support is needed unless otherwise documented below in the visit note. 

## 2015-05-15 ENCOUNTER — Telehealth: Payer: Self-pay | Admitting: Family Medicine

## 2015-05-15 MED ORDER — PROMETHAZINE HCL 12.5 MG PO TABS: ORAL_TABLET | ORAL | Status: DC

## 2015-05-15 NOTE — Telephone Encounter (Signed)
Agree/noted. 

## 2015-05-15 NOTE — Telephone Encounter (Signed)
Patient is requesting xray of L leg order to be sent to [facility] [phone]. She would like to go this evening.

## 2015-05-15 NOTE — Telephone Encounter (Signed)
Per Dr. Anitra Lauth, can not order xray without f/u. We are unable to get her in soon enough to follow up. Please advise pt to go to ER or urgent care as recommended earlier today. Pt advised and voiced understanding.

## 2015-05-15 NOTE — Telephone Encounter (Signed)
Phenergan eRx'd to her pharmacy.

## 2015-05-15 NOTE — Telephone Encounter (Signed)
Patient states that the pain medication Rx'd for leg is making her nauseated and pt would like to see if there's something else she can try or if she can get Rx for nausea medication.

## 2015-05-15 NOTE — Telephone Encounter (Signed)
Pt was advised Rx was sent in. (by Lattie Haw)  Pt LMOM on 05/15/15 at 12:19pm she stated that her leg is worse and she's not sure what to do. I spoke to pt and she stated that there is more swelling, redness and pain.  Please advise. Thanks.

## 2015-05-15 NOTE — Telephone Encounter (Signed)
Please advise. Thanks.  

## 2015-05-16 NOTE — Telephone Encounter (Signed)
Patient went to [facility] this morning and NP called because patient stated that she was only to get X-rays and not be seen.  I told NP that patient needed to be seen per previous phone note.  Patient has already been made aware of this yesterday.

## 2015-05-19 ENCOUNTER — Telehealth: Payer: Self-pay | Admitting: *Deleted

## 2015-05-19 ENCOUNTER — Encounter: Payer: Self-pay | Admitting: Family Medicine

## 2015-05-19 NOTE — Telephone Encounter (Signed)
Letter printed.

## 2015-05-19 NOTE — Telephone Encounter (Signed)
Letter faxed per pt request.

## 2015-05-19 NOTE — Telephone Encounter (Signed)
Pt LMOM on 05/19/15 at 9:58am requesting a doctors note for 05/15/15 she stated that she missed work due to her leg pain. She would like this faxed to [phone]. Please advise. Thanks.

## 2015-05-22 ENCOUNTER — Other Ambulatory Visit: Payer: Self-pay | Admitting: *Deleted

## 2015-05-22 MED ORDER — HYDROCODONE-ACETAMINOPHEN 5-325 MG PO TABS: 1.0000 | ORAL_TABLET | Freq: Four times a day (QID) | ORAL | Status: DC | PRN

## 2015-05-22 NOTE — Telephone Encounter (Signed)
Pt LMOM on 05/22/15 at 3:15pm requesting refill for Hydrocodone. She stated that she is out and her apt is not til 05/26/15. Please advise. Thanks.

## 2015-05-22 NOTE — Telephone Encounter (Signed)
Left message on cell vm stating that Rx was ready for p/u at front desk.

## 2015-05-23 ENCOUNTER — Telehealth: Payer: Self-pay | Admitting: Family Medicine

## 2015-05-23 NOTE — Telephone Encounter (Signed)
Patient leg is swelling more and has become red. Please advise.

## 2015-05-23 NOTE — Telephone Encounter (Signed)
Spoke to Dr. Anitra Lauth he stated that if pt feels like her leg is worse then she needs to be seen. I advised pt and she wanted to know if she could wait til her f/u apt Monday. I advised pt that that would be up to her that we are not able to assess her leg over the phone. I advised her that if she felt she needed to be seen sooner she could go the the Saturday clinic at [facility]. Pt voiced understanding.

## 2015-05-26 ENCOUNTER — Ambulatory Visit (INDEPENDENT_AMBULATORY_CARE_PROVIDER_SITE_OTHER): Payer: BLUE CROSS/BLUE SHIELD | Admitting: Family Medicine

## 2015-05-26 ENCOUNTER — Encounter: Payer: Self-pay | Admitting: *Deleted

## 2015-05-26 ENCOUNTER — Encounter: Payer: Self-pay | Admitting: Family Medicine

## 2015-05-26 VITALS — BP 153/76 | HR 102 | Temp 98.2°F | Resp 16 | Ht 67.0 in | Wt 278.0 lb

## 2015-05-26 DIAGNOSIS — S8012XD Contusion of left lower leg, subsequent encounter: Secondary | ICD-10-CM

## 2015-05-26 DIAGNOSIS — F329 Major depressive disorder, single episode, unspecified: Secondary | ICD-10-CM

## 2015-05-26 DIAGNOSIS — F418 Other specified anxiety disorders: Secondary | ICD-10-CM | POA: Diagnosis not present

## 2015-05-26 DIAGNOSIS — F32A Depression, unspecified: Secondary | ICD-10-CM

## 2015-05-26 DIAGNOSIS — F419 Anxiety disorder, unspecified: Principal | ICD-10-CM

## 2015-05-26 MED ORDER — PROMETHAZINE HCL 12.5 MG PO TABS: ORAL_TABLET | ORAL | Status: DC

## 2015-05-26 MED ORDER — DULOXETINE HCL 60 MG PO CPEP: 60.0000 mg | ORAL_CAPSULE | Freq: Every day | ORAL | Status: DC

## 2015-05-26 MED ORDER — GABAPENTIN 300 MG PO CAPS: 300.0000 mg | ORAL_CAPSULE | Freq: Two times a day (BID) | ORAL | Status: DC

## 2015-05-26 NOTE — Progress Notes (Signed)
OFFICE VISIT  05/26/2015   CC:  Chief Complaint  Patient presents with  . Follow-up    Anxeity and Leg     HPI:    Patient is a 35 y.o. Caucasian female who presents for 3 mo f/u depression and anxiety, as well as f/u for 2 wk f/u for L LL contusion.  For contusion: after seeing me about 2 wks ago, things waxed and waned and she went to UC where x-ray was normal and was referred to ortho and x-ray was normal there as well and dx of severe contusion was given.  She was rx'd some additional pain meds (1 week's worth) and phenergen.  Anx/dep: changed to duloxetine 60mg  qhs last visit 3 mo ago (was on citalopram 20mg  qd).  Also kept her on clonazepam 1mg  bid prn.  Feels improved but still struggling with anxiety/worry, has constant negative attitude.  Only minimal problem with depressed at this time. No panic attacks.  No SI or HI.  She is in a good relationship.  She is not seeing a counselor currently.  NO side effects from duloxetine.  She finds the clonaz 1mg  bid SCHEDULED helpful. Normally sleeps well.  Appetite is normal.     Past Medical History  Diagnosis Date  . Polycystic ovarian syndrome   . Anxiety     Saw a Psychiatrist (Dr. Orland Mustard) but not as of summer 2014.  . Tobacco dependence   . Obesity, Class II, BMI 35-39.9   . Palpitations 01/11/2012  . Depression, major ([place]) 02/2013  . Chronic tension headaches     (occipital) with medication overuse component  . Chronic neck pain     Past Surgical History  Procedure Laterality Date  . Ovarian cyst removal  Age 35 yrs    Large  (Grapefruit size per pt)    Outpatient Prescriptions Prior to Visit  Medication Sig Dispense Refill  . clonazePAM (KLONOPIN) 1 MG tablet Take 1 tablet (1 mg total) by mouth 2 (two) times daily as needed for anxiety. 60 tablet 5  . metFORMIN (GLUCOPHAGE) 500 MG tablet Take 1 tablet (500 mg total) by mouth 2 (two) times daily with a meal. 60 tablet 11  . spironolactone (ALDACTONE) 50 MG tablet  TAKE 1 TABLET (50 MG TOTAL) BY MOUTH DAILY. 30 tablet 11  . DULoxetine (CYMBALTA) 60 MG capsule Take 1 capsule (60 mg total) by mouth daily. 30 capsule 0  . cephALEXin (KEFLEX) 500 MG capsule 1 cap po tid x 10d (Patient not taking: Reported on 05/26/2015) 30 capsule 0  . HYDROcodone-acetaminophen (NORCO/VICODIN) 5-325 MG tablet Take 1-2 tablets by mouth every 6 (six) hours as needed for moderate pain. (Patient not taking: Reported on 05/26/2015) 20 tablet 0  . promethazine (PHENERGAN) 12.5 MG tablet 1-2 tabs po q6h prn nausea (Patient not taking: Reported on 05/26/2015) 30 tablet 0   No facility-administered medications prior to visit.    No Known Allergies  ROS As per HPI  PE: Blood pressure 153/76, pulse 102, temperature 98.2 F (36.8 C), temperature source Oral, resp. rate 16, height 5\' 7"  (1.702 m), weight 278 lb (126.1 kg), SpO2 96 %. Gen: Alert, well appearing.  Patient is oriented to person, place, time, and situation. AFFECT: pleasant, lucid thought and speech.  Left lower leg with well demarcated area of violaceous and erythematous skin that is firm, warm, and tender to palpation.  This area is approx 5 inches horizontal diameter and 6.5 inches cranio-caudal diameter.  No pitting edema.  DP and  PT pulses 2+ bilat.    LABS:    Chemistry      Component Value Date/Time   NA 139 03/03/2015 1601   K 3.9 03/03/2015 1601   CL 101 03/03/2015 1601   CO2 30 03/03/2015 1601   BUN 8 03/03/2015 1601   CREATININE 0.58 03/03/2015 1601   CREATININE 0.65 01/11/2012 2051      Component Value Date/Time   CALCIUM 9.4 03/03/2015 1601   ALKPHOS 70 03/03/2015 1601   AST 18 03/03/2015 1601   ALT 28 03/03/2015 1601   BILITOT 0.3 03/03/2015 1601     Lab Results  Component Value Date   HGBA1C 5.7 03/03/2015     IMPRESSION AND PLAN:  1) GAD with MDD. Her depression has nearly completely responded to the duloxetine, and her anxiety is better but in need of additional treatment.  Clonaz  1mg  bid is helpful. Will add neurontin 300mg  qhs x 7d, then take 300 mg qAM and qhs.  Therapeutic expectations and side effect profile of medication discussed today.  Patient's questions answered.  2) Left tibial area contusion: gradually improving.  Recommended rest, elevation, ice prn, and monitor for s/s of infection and for s/s of DVT. I do not think it is infected at this time.  Spent 25 min with pt today, with >50% of this time spent in counseling and care coordination regarding the above problems.  An After Visit Summary was printed and given to the patient.  FOLLOW UP: Return in about 6 weeks (around 07/07/2015) for f/u anx/dep and L leg contusion.

## 2015-05-26 NOTE — Patient Instructions (Signed)
Take one neurontin (gabapentin) 300 mg tab at bedtime every night. In 7 days, start taking one neurontin every morning and continue one every night.

## 2015-05-26 NOTE — Progress Notes (Signed)
Pre visit review using our clinic review tool, if applicable. No additional management support is needed unless otherwise documented below in the visit note. 

## 2015-06-05 ENCOUNTER — Other Ambulatory Visit: Payer: Self-pay | Admitting: *Deleted

## 2015-06-05 NOTE — Telephone Encounter (Signed)
By office notes it appears original injury was about 4 weeks ago. She has been evaluated twice in this office, last being 10 days ago and was released to work for the following day.  She was also evaluated in the UC and by orthopedics and had normal xrays at each location prior to her follow-up with PCP 10 days ago. She has been given norco 5-325 #20 at first evaluation 4 weeks ago, and then again at orthopedics ("1 weeks worth"). - Pt is requesting a work note for past two days and additional norco.  Please call pt: - A normal course of contusion without fracture,such as in her case, should be improving and almost resolved by 4 weeks. Contusions take a month or two to completely resolve, but she should not still be needing narcotics at this point and should be able to control the pain with NSAIDS. - If she feels she is having enough pain to need narcotics and be off work, then she should be seen for evaluation.  - I would be happy to see her today or tomorrow. If she is in severe pain, she can also go to UC.   Electronically Signed by: Howard Pouch, DO [facility]

## 2015-06-05 NOTE — Telephone Encounter (Signed)
Dr. Anitra Lauth pt. Pt advised that Dr. Anitra Lauth is out of the office this week and part of next week.   Pt called requesting refill for her hydrocodone/apap. She stated that she is still having a lot of pain in her leg.  RF request for hydrocodone/apap LOV: 05/26/15 Next ov: None Last written: 05/22/15 #20 w/ 0RF  Pt also requested a note for work to excuse her for yesterday (06/04/15) and today (06/05/15). Please advise. Thanks.

## 2015-06-05 NOTE — Telephone Encounter (Signed)
Pt advised. She stated that she has already been seen by an UC and Orthopedic and they told her that she had a really bad contusion and it would get much worse before it got better. I advised pt that we can not give her any refills or a note for work with out seeing her. I offered pt an apt here today with Dr. Raoul Pitch and pt refused, she stated that she lives in [facility] and that is too much of a drive for her.

## 2015-06-09 ENCOUNTER — Other Ambulatory Visit: Payer: Self-pay

## 2015-06-09 NOTE — Telephone Encounter (Signed)
Recvd Rx Fax for: Citalopram 20MG , 1 tab qd Declined: Ineffective per previous med history notes

## 2015-07-10 ENCOUNTER — Ambulatory Visit: Payer: BLUE CROSS/BLUE SHIELD | Admitting: Family Medicine

## 2015-07-14 ENCOUNTER — Ambulatory Visit: Payer: BLUE CROSS/BLUE SHIELD | Admitting: Family Medicine

## 2015-07-16 ENCOUNTER — Other Ambulatory Visit: Payer: Self-pay | Admitting: *Deleted

## 2015-07-16 MED ORDER — GABAPENTIN 300 MG PO CAPS: 300.0000 mg | ORAL_CAPSULE | Freq: Two times a day (BID) | ORAL | Status: DC

## 2015-07-16 NOTE — Telephone Encounter (Signed)
RF request for gabapentin LOV: 05/26/15 Next ov: 07/21/15 Last written: 05/26/15 #60 w/ 1RF  Please advise. Thanks.

## 2015-07-21 ENCOUNTER — Encounter: Payer: Self-pay | Admitting: Family Medicine

## 2015-07-21 ENCOUNTER — Ambulatory Visit (INDEPENDENT_AMBULATORY_CARE_PROVIDER_SITE_OTHER): Payer: BLUE CROSS/BLUE SHIELD | Admitting: Family Medicine

## 2015-07-21 VITALS — BP 116/73 | HR 80 | Temp 98.6°F | Resp 16 | Ht 67.0 in | Wt 278.2 lb

## 2015-07-21 DIAGNOSIS — S8012XD Contusion of left lower leg, subsequent encounter: Secondary | ICD-10-CM

## 2015-07-21 DIAGNOSIS — F32A Depression, unspecified: Secondary | ICD-10-CM

## 2015-07-21 DIAGNOSIS — F339 Major depressive disorder, recurrent, unspecified: Secondary | ICD-10-CM

## 2015-07-21 DIAGNOSIS — F418 Other specified anxiety disorders: Secondary | ICD-10-CM | POA: Diagnosis not present

## 2015-07-21 DIAGNOSIS — F419 Anxiety disorder, unspecified: Secondary | ICD-10-CM

## 2015-07-21 DIAGNOSIS — F329 Major depressive disorder, single episode, unspecified: Secondary | ICD-10-CM

## 2015-07-21 MED ORDER — LITHIUM CARBONATE ER 300 MG PO TBCR: 300.0000 mg | EXTENDED_RELEASE_TABLET | Freq: Two times a day (BID) | ORAL | Status: DC

## 2015-07-21 NOTE — Progress Notes (Signed)
OFFICE VISIT  07/21/2015   CC:  Chief Complaint  Patient presents with  . Follow-up    Anxiety, Depression, and left leg contusion     HPI:    Patient is a 35 y.o. Caucasian female who presents for 2 mo f/u anxiety and depression as well as L LL contusion. "not doing well".  "I either feel too much or nothin".  "I feel lost".   She is going to work.  To bed about 11, gets up 6:30 for work.  Good appetite.  Sleep quality is variable.  Initiation not a problem.  Concentration is poor, says it is hard to relax, having lots of upper body tension and occipital HAs. Some passive suicidal ideation on one occasion only.  Still in serious relationship with a man and says this is going good. She can't name any reason in her life for the way she feels.  Compliant with psych meds as listed below in med section.  We added neurontin 300 mg bid last visit to try to help with some of her chronic anxiety.  L LL contusion continues to slowly resolve.  No pain or limp, just some skin discoloration and mild decreased sensation in the area.  Past Medical History  Diagnosis Date  . Polycystic ovarian syndrome   . Anxiety     Saw a Psychiatrist (Dr. Orland Mustard) but not as of summer 2014.  . Tobacco dependence   . Obesity, Class II, BMI 35-39.9   . Palpitations 01/11/2012  . Depression, major ([place]) 02/2013  . Chronic tension headaches     (occipital) with medication overuse component  . Chronic neck pain     Past Surgical History  Procedure Laterality Date  . Ovarian cyst removal  Age 35 yrs    Large  (Grapefruit size per pt)    Outpatient Prescriptions Prior to Visit  Medication Sig Dispense Refill  . clonazePAM (KLONOPIN) 1 MG tablet Take 1 tablet (1 mg total) by mouth 2 (two) times daily as needed for anxiety. 60 tablet 5  . DULoxetine (CYMBALTA) 60 MG capsule Take 1 capsule (60 mg total) by mouth daily. 30 capsule 6  . metFORMIN (GLUCOPHAGE) 500 MG tablet Take 1 tablet (500 mg total) by mouth 2  (two) times daily with a meal. 60 tablet 11  . spironolactone (ALDACTONE) 50 MG tablet TAKE 1 TABLET (50 MG TOTAL) BY MOUTH DAILY. 30 tablet 11  . gabapentin (NEURONTIN) 300 MG capsule Take 1 capsule (300 mg total) by mouth 2 (two) times daily. 60 capsule 1  . HYDROcodone-acetaminophen (NORCO/VICODIN) 5-325 MG tablet Reported on 07/21/2015  0  . promethazine (PHENERGAN) 12.5 MG tablet 1-2 tabs po q6h prn nausea (Patient not taking: Reported on 07/21/2015) 30 tablet 1   No facility-administered medications prior to visit.    No Known Allergies  ROS As per HPI  PE: Blood pressure 116/73, pulse 80, temperature 98.6 F (37 C), temperature source Oral, resp. rate 16, height 5\' 7"  (1.702 m), weight 278 lb 4 oz (126.213 kg), SpO2 96 %. Gen: Alert, well appearing.  Patient is oriented to person, place, time, and situation. L LL with pretibial---ankle area w/brownish splotchy discoloration anteromedially.  No tenderness or warmth or edema.  LABS:    Chemistry      Component Value Date/Time   NA 139 03/03/2015 1601   K 3.9 03/03/2015 1601   CL 101 03/03/2015 1601   CO2 30 03/03/2015 1601   BUN 8 03/03/2015 1601   CREATININE 0.58  03/03/2015 1601   CREATININE 0.65 01/11/2012 2051      Component Value Date/Time   CALCIUM 9.4 03/03/2015 1601   ALKPHOS 70 03/03/2015 1601   AST 18 03/03/2015 1601   ALT 28 03/03/2015 1601   BILITOT 0.3 03/03/2015 1601     Lab Results  Component Value Date   TSH 1.966 01/11/2012    IMPRESSION AND PLAN:  1) Treatment resistant depression, GAD. Pt declines referral to psychiatrist. Will continue current meds except d/c neurontin. Also, add lithium ER 300 mg bid. Check lithium trough in 2 wks.  2) L LL contusion, gradually resolving.  Spent 25 min with pt today, with >50% of this time spent in counseling and care coordination regarding the above problems.  An After Visit Summary was printed and given to the patient.   FOLLOW UP: Return in about 1  month (around 08/20/2015) for f/u anx/dep.  Signed:  Crissie Sickles, MD           07/21/2015

## 2015-07-21 NOTE — Addendum Note (Signed)
Addended by: Onalee Hua on: 07/21/2015 11:35 AM   Modules accepted: Orders

## 2015-07-21 NOTE — Addendum Note (Signed)
Addended by: Onalee Hua on: 07/21/2015 11:34 AM   Modules accepted: Orders

## 2015-07-21 NOTE — Progress Notes (Signed)
Pre visit review using our clinic review tool, if applicable. No additional management support is needed unless otherwise documented below in the visit note. 

## 2015-07-21 NOTE — Addendum Note (Signed)
Addended by: Onalee Hua on: 07/21/2015 11:32 AM   Modules accepted: Orders

## 2015-08-05 DIAGNOSIS — F339 Major depressive disorder, recurrent, unspecified: Secondary | ICD-10-CM | POA: Diagnosis not present

## 2015-08-06 ENCOUNTER — Telehealth: Payer: Self-pay | Admitting: *Deleted

## 2015-08-06 NOTE — Telephone Encounter (Signed)
Received a call from [facility] lab yesterday wanting to know if they could cancel order for lithium placed on 07/21/15. I advised them to hold order and I would contact pt to see if she will go to get this lab done. Lab tech voiced understanding. Tried calling pt, NA, left message for pt to call back.

## 2015-08-06 NOTE — Telephone Encounter (Signed)
FYI. Pt stated that she had this lab done yesterday 08/05/15.

## 2015-08-06 NOTE — Telephone Encounter (Signed)
Noted  

## 2015-08-07 LAB — LITHIUM LEVEL: LITHIUM LVL: 0.2 meq/L — AB (ref 0.80–1.40)

## 2015-08-20 ENCOUNTER — Ambulatory Visit: Payer: BLUE CROSS/BLUE SHIELD | Admitting: Family Medicine

## 2015-08-22 ENCOUNTER — Ambulatory Visit: Payer: BLUE CROSS/BLUE SHIELD | Admitting: Family Medicine

## 2015-08-22 DIAGNOSIS — Z0289 Encounter for other administrative examinations: Secondary | ICD-10-CM

## 2015-09-03 ENCOUNTER — Other Ambulatory Visit: Payer: Self-pay | Admitting: *Deleted

## 2015-09-03 MED ORDER — CLONAZEPAM 1 MG PO TABS: 1.0000 mg | ORAL_TABLET | Freq: Two times a day (BID) | ORAL | Status: DC | PRN

## 2015-09-03 NOTE — Telephone Encounter (Signed)
Rx faxed

## 2015-09-03 NOTE — Telephone Encounter (Signed)
RF request for clonazepam LOV: 07/21/15 Next ov: None Last written: 03/03/15 #60 w/ 5RF  Please advise. Thanks.

## 2015-09-29 ENCOUNTER — Other Ambulatory Visit: Payer: Self-pay | Admitting: *Deleted

## 2015-09-29 MED ORDER — LITHIUM CARBONATE ER 300 MG PO TBCR: 300.0000 mg | EXTENDED_RELEASE_TABLET | Freq: Two times a day (BID) | ORAL | Status: DC

## 2015-09-29 NOTE — Telephone Encounter (Signed)
RF request for lithium LOV: 07/21/15 Next ov: None Last written: 07/21/15 #60 w/ 1RF  Please advise. Thanks.

## 2015-09-29 NOTE — Telephone Encounter (Signed)
I sent in 1 mo supply with no additional RF's. Pt needs office f/u prior to FURTHER RF's.-thx

## 2015-09-30 NOTE — Telephone Encounter (Signed)
Left message for pt to call back  °

## 2015-10-03 NOTE — Telephone Encounter (Signed)
Pt advised and voiced understanding. She stated that she will call back to schedule an apt. 

## 2015-10-06 ENCOUNTER — Encounter: Payer: Self-pay | Admitting: Family Medicine

## 2015-10-06 ENCOUNTER — Ambulatory Visit (INDEPENDENT_AMBULATORY_CARE_PROVIDER_SITE_OTHER): Payer: BLUE CROSS/BLUE SHIELD | Admitting: Family Medicine

## 2015-10-06 VITALS — BP 118/70 | HR 99 | Temp 98.7°F | Resp 16 | Ht 67.0 in | Wt 278.0 lb

## 2015-10-06 DIAGNOSIS — F32A Depression, unspecified: Secondary | ICD-10-CM

## 2015-10-06 DIAGNOSIS — J069 Acute upper respiratory infection, unspecified: Secondary | ICD-10-CM | POA: Diagnosis not present

## 2015-10-06 DIAGNOSIS — F329 Major depressive disorder, single episode, unspecified: Secondary | ICD-10-CM

## 2015-10-06 DIAGNOSIS — Z79899 Other long term (current) drug therapy: Secondary | ICD-10-CM

## 2015-10-06 DIAGNOSIS — F418 Other specified anxiety disorders: Secondary | ICD-10-CM

## 2015-10-06 DIAGNOSIS — F419 Anxiety disorder, unspecified: Secondary | ICD-10-CM

## 2015-10-06 LAB — COMPREHENSIVE METABOLIC PANEL
ALBUMIN: 4.3 g/dL (ref 3.6–5.1)
ALK PHOS: 77 U/L (ref 33–115)
ALT: 24 U/L (ref 6–29)
AST: 16 U/L (ref 10–30)
BUN: 8 mg/dL (ref 7–25)
CALCIUM: 9.3 mg/dL (ref 8.6–10.2)
CO2: 26 mmol/L (ref 20–31)
Chloride: 101 mmol/L (ref 98–110)
Creat: 0.75 mg/dL (ref 0.50–1.10)
Glucose, Bld: 144 mg/dL — ABNORMAL HIGH (ref 65–99)
POTASSIUM: 3.9 mmol/L (ref 3.5–5.3)
Sodium: 140 mmol/L (ref 135–146)
TOTAL PROTEIN: 7.2 g/dL (ref 6.1–8.1)
Total Bilirubin: 0.2 mg/dL (ref 0.2–1.2)

## 2015-10-06 MED ORDER — DULOXETINE HCL 60 MG PO CPEP: 60.0000 mg | ORAL_CAPSULE | Freq: Every day | ORAL | Status: DC

## 2015-10-06 MED ORDER — LITHIUM CARBONATE ER 300 MG PO TBCR: 300.0000 mg | EXTENDED_RELEASE_TABLET | Freq: Two times a day (BID) | ORAL | Status: DC

## 2015-10-06 NOTE — Patient Instructions (Signed)
Get otc generic allegra D, zytec D, or claritin D and take as directed on packaging for nasal /sinus congestion. Use otc generic saline nasal spray 2-3 times per day to irrigate/moisturize your nasal passages.

## 2015-10-06 NOTE — Progress Notes (Signed)
OFFICE VISIT  10/06/2015   CC:  Chief Complaint  Patient presents with  . URI    x 3 days  . Follow-up    anxiety and depression   HPI:    Patient is a 35 y.o. Caucasian female who presents for respiratory complaints. Additionally, she is overdue for f/u to discuss her chronic anxiety and depression.  Onset of nasal mucous, sT, sinus HA 3-4 days ago.  Overall feels generalized weakness/fatigue, mildly achy.  No fevers.  A little bit of cough.  Took sinex, otc tylenol cold med. No nasal sprays.  Several sick contacts at her job.  No wheezing, chest tightness, or SOB.  Says she is feeling improvement in her chronically depressed mood since getting on lithium 300mg  bid a few months ago.  She describes mild flattening of her affect but says "that's ok for now".  She wants to continue on the current psych med regimen she is on.  She continues to function fairly well, working hard.    Past Medical History  Diagnosis Date  . Polycystic ovarian syndrome   . Anxiety     Saw a Psychiatrist (Dr. Orland Mustard) but not as of summer 2014.  . Tobacco dependence   . Obesity, Class II, BMI 35-39.9   . Palpitations 01/11/2012  . Depression, major ([place]) 02/2013  . Chronic tension headaches     (occipital) with medication overuse component  . Chronic neck pain     Past Surgical History  Procedure Laterality Date  . Ovarian cyst removal  Age 50 yrs    Large  (Grapefruit size per pt)    Outpatient Prescriptions Prior to Visit  Medication Sig Dispense Refill  . clonazePAM (KLONOPIN) 1 MG tablet Take 1 tablet (1 mg total) by mouth 2 (two) times daily as needed for anxiety. 60 tablet 5  . metFORMIN (GLUCOPHAGE) 500 MG tablet Take 1 tablet (500 mg total) by mouth 2 (two) times daily with a meal. 60 tablet 11  . spironolactone (ALDACTONE) 50 MG tablet TAKE 1 TABLET (50 MG TOTAL) BY MOUTH DAILY. 30 tablet 11  . DULoxetine (CYMBALTA) 60 MG capsule Take 1 capsule (60 mg total) by mouth daily. 30 capsule 6   . lithium carbonate (LITHOBID) 300 MG CR tablet Take 1 tablet (300 mg total) by mouth 2 (two) times daily. 60 tablet 0   No facility-administered medications prior to visit.    No Known Allergies  ROS As per HPI  PE: Blood pressure 118/70, pulse 99, temperature 98.7 F (37.1 C), temperature source Oral, resp. rate 16, height 5\' 7"  (1.702 m), weight 278 lb (126.1 kg), SpO2 98 %. VS: noted--normal. Gen: alert, NAD, NONTOXIC APPEARING. AFFECT: pleasant, lucid thought and speech. HEENT: eyes without injection, drainage, or swelling.  Ears: EACs clear, TMs with normal light reflex and landmarks.  Nose: Clear rhinorrhea, with some dried, crusty exudate adherent to mildly injected mucosa.  No purulent d/c.  No paranasal sinus TTP.  No facial swelling.  Throat and mouth without focal lesion.  No pharyngial swelling, erythema, or exudate.   Neck: supple, no LAD.   LUNGS: CTA bilat, nonlabored resps.   CV: RRR, no m/r/g. EXT: no c/c/e SKIN: no rash  LABS:  Lab Results  Component Value Date   TSH 1.966 01/11/2012    Lab Results  Component Value Date   CREATININE 0.58 03/03/2015   BUN 8 03/03/2015   NA 139 03/03/2015   K 3.9 03/03/2015   CL 101 03/03/2015  CO2 30 03/03/2015   Lab Results  Component Value Date   ALT 28 03/03/2015   AST 18 03/03/2015   ALKPHOS 70 03/03/2015   BILITOT 0.3 03/03/2015   Lab Results  Component Value Date   LITHIUM 0.20* 08/05/2015   NA 139 03/03/2015   BUN 8 03/03/2015   CREATININE 0.58 03/03/2015   TSH 1.966 01/11/2012   IMPRESSION AND PLAN:  1) Viral URI; Trial of mucinex DM or robitussin DM otc as directed on the box. May use OTC nasal saline spray or irrigation solution bid. OTC nonsedating antihistamines prn discussed.  Decongestant use discussed--ok if tolerated in the past w/out side effect and if pt has no hx of HTN.  2) Chronic anxiety and chronic depression (treatment resistant): improved since addition of lithium 300 mg bid a  few months ago.  Tolerating med well.  Lithium level was fine 08/2015.  No dose change after this level. Will check CMET today--high risk med use.  An After Visit Summary was printed and given to the patient.  FOLLOW UP:  6 mo  Signed:  Crissie Sickles, MD           10/06/2015

## 2015-10-06 NOTE — Progress Notes (Signed)
Pre visit review using our clinic review tool, if applicable. No additional management support is needed unless otherwise documented below in the visit note. 

## 2016-03-12 ENCOUNTER — Other Ambulatory Visit: Payer: Self-pay | Admitting: *Deleted

## 2016-03-12 MED ORDER — SPIRONOLACTONE 50 MG PO TABS: ORAL_TABLET | ORAL | 3 refills | Status: DC

## 2016-03-12 MED ORDER — METFORMIN HCL 500 MG PO TABS: 500.0000 mg | ORAL_TABLET | Freq: Two times a day (BID) | ORAL | 3 refills | Status: DC

## 2016-03-12 NOTE — Telephone Encounter (Signed)
Fax from [place] requesting refill for spironolactone and metformin.  LOV: 03/03/15 NOV: None Last written: 03/03/15 #30 w/ 11RF (both)

## 2016-03-23 ENCOUNTER — Other Ambulatory Visit: Payer: Self-pay | Admitting: *Deleted

## 2016-03-23 MED ORDER — CLONAZEPAM 1 MG PO TABS: 1.0000 mg | ORAL_TABLET | Freq: Two times a day (BID) | ORAL | 1 refills | Status: DC | PRN

## 2016-03-23 NOTE — Telephone Encounter (Signed)
Fax from [facility] requesting refill for clonazepam.  LOV: 10/06/15 NOV: None Last written: 09/03/15 #60 w/ 5RF  Please advise. Thanks.

## 2016-03-23 NOTE — Telephone Encounter (Signed)
Will fill rx for clonazepam for 30d supply with 1 RF.  Pt needs to f/u sometime in the next 2 months.-thx

## 2016-03-23 NOTE — Telephone Encounter (Signed)
Rx faxed

## 2016-05-25 ENCOUNTER — Other Ambulatory Visit: Payer: Self-pay | Admitting: *Deleted

## 2016-05-25 MED ORDER — SPIRONOLACTONE 50 MG PO TABS: ORAL_TABLET | ORAL | 1 refills | Status: DC

## 2016-05-25 MED ORDER — LITHIUM CARBONATE ER 300 MG PO TBCR: 300.0000 mg | EXTENDED_RELEASE_TABLET | Freq: Two times a day (BID) | ORAL | 1 refills | Status: DC

## 2016-05-25 MED ORDER — METFORMIN HCL 500 MG PO TABS: 500.0000 mg | ORAL_TABLET | Freq: Two times a day (BID) | ORAL | 1 refills | Status: DC

## 2016-05-25 NOTE — Telephone Encounter (Signed)
I approved 30 d supply of lithium, spironolactone, and metformin, with 1 additional RF a piece, but she needs to arrange f/u  (preferably 30 min) before any FURTHER refills.--thx

## 2016-05-25 NOTE — Telephone Encounter (Signed)
Fax from [place], requesting 90 day supply.  RF request for lithium LOV: 10/06/15 Next ov: None Last written: 10/06/15 #60 w/ 6RF  RF request for spironolactone Last written: 03/12/16 #30 w/ 3RF  RF request for metformin Last written: 03/12/16 #60 w/ 3RF  Please advise. Thanks.

## 2016-05-26 NOTE — Telephone Encounter (Signed)
Left message for pt to call back  °

## 2016-06-07 ENCOUNTER — Other Ambulatory Visit: Payer: Self-pay | Admitting: Family Medicine

## 2016-06-07 ENCOUNTER — Telehealth: Payer: Self-pay | Admitting: Family Medicine

## 2016-06-07 MED ORDER — CLONAZEPAM 1 MG PO TABS: 1.0000 mg | ORAL_TABLET | Freq: Two times a day (BID) | ORAL | 0 refills | Status: DC | PRN

## 2016-06-07 NOTE — Telephone Encounter (Signed)
Clonaz rx printed. 

## 2016-06-07 NOTE — Telephone Encounter (Signed)
Mar 19th is fine with me--I think this is appropriate.--thx

## 2016-06-07 NOTE — Telephone Encounter (Signed)
Patient scheduled appointment 06/21/16 @ 8:15am.  Patient states she needs a rf of clonazepam.  Please advise.

## 2016-06-07 NOTE — Telephone Encounter (Signed)
Rx faxed. Left message on pts phone stating that Rx requested has been faxed to pharmacy.

## 2016-06-07 NOTE — Telephone Encounter (Signed)
His first 30 min opening is March 19th. Should I schedule her with Dr. Raoul Pitch?

## 2016-06-07 NOTE — Telephone Encounter (Signed)
Please advise 

## 2016-06-07 NOTE — Telephone Encounter (Signed)
Patient called and stated that she received a phone call to make a f/u appointment to discuss medications. I did not see where a telephone encounter was created from said call however, I attempted to make an appointment after patient was convinced she received a call.  When attempting to make 60min f/u for today as requested by patient, restrictions would not allow me to do so for patient.   Please call patient today at [phone] to discuss appointment times that Dr. Anitra Lauth has available.

## 2016-06-07 NOTE — Telephone Encounter (Signed)
Please call pt to schedule 30 min apt. Thanks.

## 2016-06-09 ENCOUNTER — Other Ambulatory Visit: Payer: Self-pay | Admitting: Family Medicine

## 2016-06-16 ENCOUNTER — Other Ambulatory Visit: Payer: Self-pay | Admitting: Family Medicine

## 2016-06-23 ENCOUNTER — Other Ambulatory Visit: Payer: Self-pay | Admitting: Family Medicine

## 2016-06-23 ENCOUNTER — Ambulatory Visit: Payer: BLUE CROSS/BLUE SHIELD | Admitting: Family Medicine

## 2016-06-23 ENCOUNTER — Other Ambulatory Visit: Payer: Self-pay | Admitting: *Deleted

## 2016-06-23 MED ORDER — METFORMIN HCL 500 MG PO TABS: 500.0000 mg | ORAL_TABLET | Freq: Two times a day (BID) | ORAL | 0 refills | Status: DC

## 2016-06-23 MED ORDER — SPIRONOLACTONE 50 MG PO TABS: ORAL_TABLET | ORAL | 0 refills | Status: DC

## 2016-06-23 MED ORDER — LITHIUM CARBONATE ER 300 MG PO TBCR
300.0000 mg | EXTENDED_RELEASE_TABLET | Freq: Two times a day (BID) | ORAL | 0 refills | Status: DC
Start: 2016-06-23 — End: 2016-07-21

## 2016-06-23 NOTE — Telephone Encounter (Signed)
Fax from [facility] [facility] requesting 90 day supply.  RF request for metformin LOV: 10/06/15 Next ov: Pt cancelled f/u apt for today due to the weather, apt has not been rescheduled Last written: 06/16/16 #60 w/ 1RF  RF request for spironolactone Last written: 05/25/16 #30 w/ 1RF  RF request for lithium Last written: 05/25/16 #60 w/ 1RF  Please advise. Thanks.

## 2016-07-09 ENCOUNTER — Other Ambulatory Visit: Payer: Self-pay | Admitting: *Deleted

## 2016-07-09 MED ORDER — CLONAZEPAM 1 MG PO TABS: 1.0000 mg | ORAL_TABLET | Freq: Two times a day (BID) | ORAL | 0 refills | Status: DC | PRN

## 2016-07-09 NOTE — Telephone Encounter (Signed)
[facility].  RF request for clonazepam LOV: 10/06/15 Next ov: None Last written: 06/07/16 #60 w/ 0RF  Please advise. Thanks.

## 2016-07-09 NOTE — Telephone Encounter (Signed)
Pt advised and voiced understanding.  Apt made for 07/21/16 at 8:30am.

## 2016-07-09 NOTE — Telephone Encounter (Signed)
Will do another 30 day supply but she needs to schedule f/u appt.  Her last appt (last month) was canceled due to the weather.-thx

## 2016-07-21 ENCOUNTER — Ambulatory Visit (INDEPENDENT_AMBULATORY_CARE_PROVIDER_SITE_OTHER): Payer: BLUE CROSS/BLUE SHIELD | Admitting: Family Medicine

## 2016-07-21 ENCOUNTER — Encounter: Payer: Self-pay | Admitting: Family Medicine

## 2016-07-21 VITALS — BP 110/78 | HR 82 | Temp 98.5°F | Resp 16 | Ht 67.0 in | Wt 278.5 lb

## 2016-07-21 DIAGNOSIS — F411 Generalized anxiety disorder: Secondary | ICD-10-CM | POA: Diagnosis not present

## 2016-07-21 DIAGNOSIS — E282 Polycystic ovarian syndrome: Secondary | ICD-10-CM | POA: Diagnosis not present

## 2016-07-21 DIAGNOSIS — E669 Obesity, unspecified: Secondary | ICD-10-CM

## 2016-07-21 DIAGNOSIS — F339 Major depressive disorder, recurrent, unspecified: Secondary | ICD-10-CM | POA: Diagnosis not present

## 2016-07-21 LAB — COMPREHENSIVE METABOLIC PANEL
ALBUMIN: 4.1 g/dL (ref 3.5–5.2)
ALT: 23 U/L (ref 0–35)
AST: 15 U/L (ref 0–37)
Alkaline Phosphatase: 73 U/L (ref 39–117)
BILIRUBIN TOTAL: 0.3 mg/dL (ref 0.2–1.2)
BUN: 10 mg/dL (ref 6–23)
CALCIUM: 9.1 mg/dL (ref 8.4–10.5)
CO2: 27 mEq/L (ref 19–32)
CREATININE: 0.56 mg/dL (ref 0.40–1.20)
Chloride: 104 mEq/L (ref 96–112)
GFR: 130.05 mL/min (ref >60.00)
Glucose, Bld: 105 mg/dL — ABNORMAL HIGH (ref 70–99)
Potassium: 4.5 mEq/L (ref 3.5–5.1)
Sodium: 137 mEq/L (ref 135–145)
TOTAL PROTEIN: 7 g/dL (ref 6.0–8.3)

## 2016-07-21 LAB — LIPID PANEL
CHOLESTEROL: 177 mg/dL (ref 0–200)
HDL: 33.3 mg/dL — AB (ref >39.00)
LDL Cholesterol: 116 mg/dL — ABNORMAL HIGH (ref 0–99)
NonHDL: 143.34
TRIGLYCERIDES: 136 mg/dL (ref 0.0–149.0)
Total CHOL/HDL Ratio: 5
VLDL: 27.2 mg/dL (ref 0.0–40.0)

## 2016-07-21 LAB — HEMOGLOBIN A1C: Hgb A1c MFr Bld: 5.8 % (ref 4.6–6.5)

## 2016-07-21 MED ORDER — DULOXETINE HCL 60 MG PO CPEP: 60.0000 mg | ORAL_CAPSULE | Freq: Every day | ORAL | 6 refills | Status: DC

## 2016-07-21 MED ORDER — ARIPIPRAZOLE 5 MG PO TABS: 5.0000 mg | ORAL_TABLET | Freq: Every day | ORAL | 1 refills | Status: DC

## 2016-07-21 MED ORDER — SPIRONOLACTONE 50 MG PO TABS: ORAL_TABLET | ORAL | 6 refills | Status: DC

## 2016-07-21 MED ORDER — METFORMIN HCL 500 MG PO TABS: 500.0000 mg | ORAL_TABLET | Freq: Two times a day (BID) | ORAL | 6 refills | Status: DC

## 2016-07-21 MED ORDER — CLONAZEPAM 1 MG PO TABS: 1.0000 mg | ORAL_TABLET | Freq: Two times a day (BID) | ORAL | 5 refills | Status: DC | PRN

## 2016-07-21 NOTE — Progress Notes (Signed)
Pre visit review using our clinic review tool, if applicable. No additional management support is needed unless otherwise documented below in the visit note. 

## 2016-07-21 NOTE — Progress Notes (Signed)
OFFICE VISIT  07/21/2016   CC:  Chief Complaint  Patient presents with  . Follow-up    RCI, pt is fasting.    HPI:    Patient is a 36 y.o. Caucasian female who presents for follow up treatment resistant depression and anxiety. I last saw her about 9 mo ago, at which time she was feeling improvement after having been on lithium CR 300 mg bid. We continued at same dose.  She remains on cymbalta and clonazepam. She is still constantly feeling depressed.  She felt a blunting of emotion on lithium eventually so she d/c'd this med 1 mo ago. This has improved since getting off lithium.  Feeling down, sad, depressed, poor energy, poor sleep, staying in bed all day long, missing at least one day a week at work.  Some passive thoughts of suicide occasionally, no HI.  Poor concentraion, lots of anxiety and worry.  Irritable and keyed up.  Her sx's are causing stress in relationship with her boyfriend. Feels like clonazepam helps some.  LMP 3 wks ago.  Also, has PCOS, is on aldactone and metformin.  Compliant with meds w/out side effect.  No exercise, no dieting.    Past Medical History:  Diagnosis Date  . Anxiety    Saw a Psychiatrist (Dr. Orland Mustard) but not as of summer 2014.  Marland Kitchen Chronic neck pain   . Chronic tension headaches    (occipital) with medication overuse component  . Depression, major 02/2013  . Obesity, Class II, BMI 35-39.9   . Palpitations 01/11/2012  . Polycystic ovarian syndrome   . Tobacco dependence     Past Surgical History:  Procedure Laterality Date  . OVARIAN CYST REMOVAL  Age 33 yrs   Large  (Grapefruit size per pt)    Outpatient Medications Prior to Visit  Medication Sig Dispense Refill  . clonazePAM (KLONOPIN) 1 MG tablet Take 1 tablet (1 mg total) by mouth 2 (two) times daily as needed for anxiety. 60 tablet 0  . DULoxetine (CYMBALTA) 60 MG capsule Take 1 capsule (60 mg total) by mouth daily. 30 capsule 6  . metFORMIN (GLUCOPHAGE) 500 MG tablet Take 1  tablet (500 mg total) by mouth 2 (two) times daily with a meal. 60 tablet 0  . spironolactone (ALDACTONE) 50 MG tablet TAKE 1 TABLET (50 MG TOTAL) BY MOUTH DAILY. 30 tablet 0  . lithium carbonate (LITHOBID) 300 MG CR tablet Take 1 tablet (300 mg total) by mouth 2 (two) times daily. (Patient not taking: Reported on 07/21/2016) 60 tablet 0   No facility-administered medications prior to visit.     No Known Allergies  ROS As per HPI  PE: Blood pressure 110/78, pulse 82, temperature 98.5 F (36.9 C), temperature source Oral, resp. rate 16, height 5\' 7"  (1.702 m), weight 278 lb 8 oz (126.3 kg), last menstrual period 07/04/2016, SpO2 97 %. Wt Readings from Last 2 Encounters:  07/21/16 278 lb 8 oz (126.3 kg)  10/06/15 278 lb (126.1 kg)    Gen: alert, oriented x 4, affect pleasant.  Lucid thinking and conversation noted. HEENT: PERRLA, EOMI.   Neck: no LAD, mass, or thyromegaly. CV: RRR, no m/r/g LUNGS: CTA bilat, nonlabored. NEURO: no tremor or tics noted on observation.  Coordination intact. CN 2-12 grossly intact bilaterally, strength 5/5 in all extremeties.  No ataxia.  LABS:  Lab Results  Component Value Date   TSH 1.966 01/11/2012    Lab Results  Component Value Date   CREATININE 0.75 10/06/2015  BUN 8 10/06/2015   NA 140 10/06/2015   K 3.9 10/06/2015   CL 101 10/06/2015   CO2 26 10/06/2015   Lab Results  Component Value Date   ALT 24 10/06/2015   AST 16 10/06/2015   ALKPHOS 77 10/06/2015   BILITOT 0.2 10/06/2015   Lab Results  Component Value Date   HGBA1C 5.7 03/03/2015   IMPRESSION AND PLAN:  1) Depression; treatment resistant.  No hx of bipolar-type symptoms. Recent failure of lithium trial. Continue cymbalta and add abilify 5mg  qd.  Therapeutic expectations and side effect profile of medication discussed today.  Patient's questions answered. RF'd cymbalta today.  2) GAD: cymbalta and clonazepam mildly helpful. No changes in meds for this problem today.   We'll see how abilify helps her mood and potentially address anxiety more at future visits if it doesn't improve along with her mood.  3) PCOS: needs to diet and exercise but her depression is preventing this due to lack of any motivation. Check CMET and HbA1c, and FLP today.  An After Visit Summary was printed and given to the patient.  FOLLOW UP: Return in about 4 weeks (around 08/18/2016) for f/u mood/anxiety.  Signed:  Crissie Sickles, MD           07/21/2016

## 2016-08-10 DIAGNOSIS — H5213 Myopia, bilateral: Secondary | ICD-10-CM | POA: Diagnosis not present

## 2016-09-26 ENCOUNTER — Other Ambulatory Visit: Payer: Self-pay | Admitting: Family Medicine

## 2016-09-27 NOTE — Telephone Encounter (Signed)
She was supposed to follow up with me 1 month after I saw her back in April. I'll allow #30 of the abilify tabs, but NO FURTHER RFs after this until seen in office for f/u visit (30 min)--make it in 2-4 weeks.-thx

## 2016-09-28 NOTE — Telephone Encounter (Signed)
Patient notified and verbalized understanding.  Patient stated that she was at work and will call back to schedule an appointment.

## 2016-11-01 ENCOUNTER — Other Ambulatory Visit: Payer: Self-pay | Admitting: Family Medicine

## 2016-11-01 NOTE — Telephone Encounter (Signed)
[facility]  RF request for abilify LOV: 07/21/16 Next ov: None Last written:

## 2016-11-17 ENCOUNTER — Encounter: Payer: Self-pay | Admitting: Family Medicine

## 2016-11-17 ENCOUNTER — Ambulatory Visit (INDEPENDENT_AMBULATORY_CARE_PROVIDER_SITE_OTHER): Payer: BLUE CROSS/BLUE SHIELD | Admitting: Family Medicine

## 2016-11-17 VITALS — BP 137/79 | HR 103 | Temp 97.8°F | Resp 16 | Ht 67.0 in | Wt 295.8 lb

## 2016-11-17 DIAGNOSIS — B070 Plantar wart: Secondary | ICD-10-CM | POA: Diagnosis not present

## 2016-11-17 DIAGNOSIS — L84 Corns and callosities: Secondary | ICD-10-CM

## 2016-11-17 NOTE — Progress Notes (Signed)
OFFICE VISIT  11/17/2016   CC:  Chief Complaint  Patient presents with  . Callouses    bottom of left foot   HPI:    Patient is a 36 y.o.  female who presents for lesion on bottom of foot.   Has L foot calluses and one painful plantar wart --present "forever and forever" and she has put off getting any of this treated.  Asks for the areas to be treated today.  She tries to shave the calluses down some but due to her body habitus and the location of the lesions, this is difficult for to do extensively.  Of note, I last saw her 07/2016 for recurrent MDD and GAD, added abilify 5mg  qd, and she was supposed to have followed up in office 4 weeks later but did not do this.  She apparently stopped the abilify b/c it was making her want to eat too much and gain wt.  Past Medical History:  Diagnosis Date  . Anxiety    Saw a Psychiatrist (Dr. Orland Mustard) but not as of summer 2014.  Marland Kitchen Chronic neck pain   . Chronic tension headaches    (occipital) with medication overuse component  . Depression, major 02/2013  . Obesity, Class II, BMI 35-39.9   . Palpitations 01/11/2012  . Polycystic ovarian syndrome   . Tobacco dependence     Past Surgical History:  Procedure Laterality Date  . OVARIAN CYST REMOVAL  Age 25 yrs   Large  (Grapefruit size per pt)    Outpatient Medications Prior to Visit  Medication Sig Dispense Refill  . clonazePAM (KLONOPIN) 1 MG tablet Take 1 tablet (1 mg total) by mouth 2 (two) times daily as needed for anxiety. 60 tablet 5  . DULoxetine (CYMBALTA) 60 MG capsule Take 1 capsule (60 mg total) by mouth daily. 30 capsule 6  . metFORMIN (GLUCOPHAGE) 500 MG tablet Take 1 tablet (500 mg total) by mouth 2 (two) times daily with a meal. 60 tablet 6  . spironolactone (ALDACTONE) 50 MG tablet TAKE 1 TABLET (50 MG TOTAL) BY MOUTH DAILY. 30 tablet 6  . ARIPiprazole (ABILIFY) 5 MG tablet TAKE 1 TABLET BY MOUTH EVERY DAY (Patient not taking: Reported on 11/17/2016) 30 tablet 2   No  facility-administered medications prior to visit.     No Known Allergies  ROS As per HPI  PE: Blood pressure 137/79, pulse (!) 103, temperature 97.8 F (36.6 C), temperature source Oral, resp. rate 16, height 5\' 7"  (1.702 m), weight 295 lb 12 oz (134.2 kg), last menstrual period 11/03/2016, SpO2 98 %. Gen: Alert, well appearing.  Patient is oriented to person, place, time, and situation. AFFECT: pleasant, lucid thought and speech. Left foot with callus overlying a plantar wart over distal 3rd metatarsal.  This is tender to firm pressure/palpation.  No erythema. There are 3 additional areas of large callused skin--one on lateral aspect of great toe, one on lateral aspect of 5th metatarsal area, and one on heel.  These areas are without tenderness or inflammation.  LABS:    Chemistry      Component Value Date/Time   NA 137 07/21/2016 0901   K 4.5 07/21/2016 0901   CL 104 07/21/2016 0901   CO2 27 07/21/2016 0901   BUN 10 07/21/2016 0901   CREATININE 0.56 07/21/2016 0901   CREATININE 0.75 10/06/2015 1512      Component Value Date/Time   CALCIUM 9.1 07/21/2016 0901   ALKPHOS 73 07/21/2016 0901   AST 15  07/21/2016 0901   ALT 23 07/21/2016 0901   BILITOT 0.3 07/21/2016 0901     Lab Results  Component Value Date   HGBA1C 5.8 07/21/2016    IMPRESSION AND PLAN:  Plantar wart L foot, with 3 additional large calluses on same foot. All of these lesions are causing the patient significant discomfort and affecting her ability to walk and stand w/out difficulty. Procedure: after prepping each callus and the plantar wart with betadine and alcohol wipe, I used a dermablade to shave down each callus SIGNIFICANTLY.  I then used histofreeze to do three 30 second freeze-thaw cycles on the plantar wart.  On the heel callus, I shave a little too deep in one location and a small cut was sustained, minimal bleeding.  Pt tolerated procedure well, no immediate complications. Wound care discussed.   Advised pt to file down the callused areas as they come back, every other day or so. If plantar wart area still significantly painful with pressure in 2 weeks then I advised her to return for another histofreeze treatment.  An After Visit Summary was printed and given to the patient.  FOLLOW UP: Return if symptoms worsen or fail to improve.  Signed:  Crissie Sickles, MD           11/17/2016

## 2016-12-04 ENCOUNTER — Other Ambulatory Visit: Payer: Self-pay | Admitting: Family Medicine

## 2017-01-20 ENCOUNTER — Encounter: Payer: Self-pay | Admitting: Family Medicine

## 2017-01-20 ENCOUNTER — Ambulatory Visit (INDEPENDENT_AMBULATORY_CARE_PROVIDER_SITE_OTHER): Payer: BLUE CROSS/BLUE SHIELD | Admitting: Family Medicine

## 2017-01-20 VITALS — BP 129/81 | HR 89 | Temp 98.4°F | Resp 16 | Ht 67.0 in | Wt 299.5 lb

## 2017-01-20 DIAGNOSIS — R159 Full incontinence of feces: Secondary | ICD-10-CM

## 2017-01-20 DIAGNOSIS — R197 Diarrhea, unspecified: Secondary | ICD-10-CM

## 2017-01-20 NOTE — Progress Notes (Signed)
OFFICE VISIT  01/20/2017   CC:  Chief Complaint  Patient presents with  . Encopresis    6 episodes, happend about a week and half ago  . Cyst    on neck   HPI:    Patient is a 36 y.o.  female who presents for having several episodes of passing loose stool w/out control over the last 2 weeks.  Occ after she gets up, occ after she eats. She admits to relatively infrequent BMs but no hard/large/difficult to pass BMs. About a month or two prior to her recent encopresis problems, he BMs started to become consistently loose and occurred 1 qd to 1 every few days.  Question of mucous in the stool, yellowish/light brown color.  No blood.  Had intermittent mild LUQ abd pain associated with this.  No fevers.  No dietary changes preceded her problems. In the last 1 week her BMs have been more solid and she has had no encopresis. No otc meds lately.  She tried increasing her fiber in her diet and she is still doing this.  No problems with controlling her urine.  No saddle anesthesia, no leg weakness. No abx prior to onset of problems.   Past Medical History:  Diagnosis Date  . Anxiety    Saw a Psychiatrist (Dr. Orland Mustard) but not as of summer 2014.  Marland Kitchen Chronic neck pain   . Chronic tension headaches    (occipital) with medication overuse component  . Depression, major 02/2013  . Obesity, Class II, BMI 35-39.9   . Palpitations 01/11/2012  . Polycystic ovarian syndrome   . Tobacco dependence     Past Surgical History:  Procedure Laterality Date  . OVARIAN CYST REMOVAL  Age 10 yrs   Large  (Grapefruit size per pt)    Outpatient Medications Prior to Visit  Medication Sig Dispense Refill  . clonazePAM (KLONOPIN) 1 MG tablet Take 1 tablet (1 mg total) by mouth 2 (two) times daily as needed for anxiety. 60 tablet 5  . DULoxetine (CYMBALTA) 60 MG capsule Take 1 capsule (60 mg total) by mouth daily. 30 capsule 6  . metFORMIN (GLUCOPHAGE) 500 MG tablet Take 1 tablet (500 mg total) by mouth 2  (two) times daily with a meal. 60 tablet 6  . spironolactone (ALDACTONE) 50 MG tablet TAKE 1 TABLET (50 MG TOTAL) BY MOUTH DAILY. 30 tablet 6   No facility-administered medications prior to visit.     No Known Allergies  ROS As per HPI  PE: Blood pressure 129/81, pulse 89, temperature 98.4 F (36.9 C), temperature source Oral, resp. rate 16, height 5' 7"  (1.702 m), weight 299 lb 8 oz (135.9 kg), last menstrual period 01/17/2017, SpO2 97 %. Pt examined with Sharen Hones, CMA, as chaperone. Gen: Alert, well appearing.  Patient is oriented to person, place, time, and situation. AFFECT: pleasant, lucid thought and speech. ABD: soft, rotund/obese but non-distended.  BS normal.  No tenderness to palpation.  No bruit or mass or HSM.   LABS:    Chemistry      Component Value Date/Time   NA 137 07/21/2016 0901   K 4.5 07/21/2016 0901   CL 104 07/21/2016 0901   CO2 27 07/21/2016 0901   BUN 10 07/21/2016 0901   CREATININE 0.56 07/21/2016 0901   CREATININE 0.75 10/06/2015 1512      Component Value Date/Time   CALCIUM 9.1 07/21/2016 0901   ALKPHOS 73 07/21/2016 0901   AST 15 07/21/2016 0901   ALT 23  07/21/2016 0901   BILITOT 0.3 07/21/2016 0901     Lab Results  Component Value Date   HGBA1C 5.8 07/21/2016    IMPRESSION AND PLAN:  1) Loose BMs, followed by a few episodes of encopresis.  Possible mild GE that is resolving, with possibly some secondary transient functional or malabsorptive process.  No red flags. Continue high fiber diet, plus add metamucil qd.  F/u 3-4 wks and if not improved, then will need to do rectal exam to check rectal tone, likely start with acute abd series for imaging, and get lab w/u (CBC, CMET, celiac panel, ESR, stool studies). Pt understands and expressed agreement with plan.  Spent 25 min with pt today, with >50% of this time spent in counseling and care coordination regarding the above problems.  An After Visit Summary was printed and given to  the patient.  FOLLOW UP: Return for f/u 3-4 weeks BMs/encop.  Signed:  Crissie Sickles, MD           01/20/2017

## 2017-01-20 NOTE — Patient Instructions (Signed)
Start taking over the counter fiber supplement called metamucil: follow dosing instructions on packaging. Try mixing with orange gatorade to make it easier to take.

## 2017-02-12 ENCOUNTER — Other Ambulatory Visit: Payer: Self-pay | Admitting: Family Medicine

## 2017-02-14 NOTE — Telephone Encounter (Signed)
[facility]  RF request for clonazepam LOV: 07/21/16 Next ov: None Last written: 07/21/16 #60 w/ 5RF  Please advise. Thanks.

## 2017-02-14 NOTE — Telephone Encounter (Signed)
Rx faxed

## 2017-02-14 NOTE — Telephone Encounter (Signed)
I will do 1 mo supply. Needs o/v before she runs out of this.-thx

## 2017-02-14 NOTE — Telephone Encounter (Signed)
Left message for pt to call back  °

## 2017-02-14 NOTE — Telephone Encounter (Signed)
Patient scheduled appt 03/07/17 @ 3:30pm.

## 2017-03-03 ENCOUNTER — Ambulatory Visit: Payer: BLUE CROSS/BLUE SHIELD | Admitting: Family Medicine

## 2017-03-03 NOTE — Progress Notes (Deleted)
OFFICE VISIT  03/03/2017   CC: No chief complaint on file.  HPI:    Patient is a 36 y.o. Caucasian female who presents for "vomiting and fever".  Past Medical History:  Diagnosis Date  . Anxiety    Saw a Psychiatrist (Dr. Orland Mustard) but not as of summer 2014.  Marland Kitchen Chronic neck pain   . Chronic tension headaches    (occipital) with medication overuse component  . Depression, major 02/2013  . Obesity, Class II, BMI 35-39.9   . Palpitations 01/11/2012  . Polycystic ovarian syndrome   . Tobacco dependence     Past Surgical History:  Procedure Laterality Date  . OVARIAN CYST REMOVAL  Age 65 yrs   Large  (Grapefruit size per pt)    Outpatient Medications Prior to Visit  Medication Sig Dispense Refill  . clonazePAM (KLONOPIN) 1 MG tablet TAKE 1 TABLET BY MOUTH TWICE A DAY AS NEEDED FOR ANXIETY 60 tablet 0  . DULoxetine (CYMBALTA) 60 MG capsule Take 1 capsule (60 mg total) by mouth daily. 30 capsule 6  . metFORMIN (GLUCOPHAGE) 500 MG tablet Take 1 tablet (500 mg total) by mouth 2 (two) times daily with a meal. 60 tablet 6  . spironolactone (ALDACTONE) 50 MG tablet TAKE 1 TABLET (50 MG TOTAL) BY MOUTH DAILY. 30 tablet 6   No facility-administered medications prior to visit.     No Known Allergies  ROS As per HPI  PE: There were no vitals taken for this visit. ***  LABS:    Chemistry      Component Value Date/Time   NA 137 07/21/2016 0901   K 4.5 07/21/2016 0901   CL 104 07/21/2016 0901   CO2 27 07/21/2016 0901   BUN 10 07/21/2016 0901   CREATININE 0.56 07/21/2016 0901   CREATININE 0.75 10/06/2015 1512      Component Value Date/Time   CALCIUM 9.1 07/21/2016 0901   ALKPHOS 73 07/21/2016 0901   AST 15 07/21/2016 0901   ALT 23 07/21/2016 0901   BILITOT 0.3 07/21/2016 0901     Lab Results  Component Value Date   CHOL 177 07/21/2016   HDL 33.30 (L) 07/21/2016   LDLCALC 116 (H) 07/21/2016   TRIG 136.0 07/21/2016   CHOLHDL 5 07/21/2016   Lab Results  Component  Value Date   HGBA1C 5.8 07/21/2016   Lab Results  Component Value Date   TSH 1.966 01/11/2012   IMPRESSION AND PLAN:  No problem-specific Assessment & Plan notes found for this encounter.   FOLLOW UP: No Follow-up on file.

## 2017-03-04 ENCOUNTER — Ambulatory Visit: Payer: BLUE CROSS/BLUE SHIELD | Admitting: Family Medicine

## 2017-03-07 ENCOUNTER — Ambulatory Visit: Payer: BLUE CROSS/BLUE SHIELD | Admitting: Family Medicine

## 2017-05-18 ENCOUNTER — Ambulatory Visit: Payer: BLUE CROSS/BLUE SHIELD | Admitting: Family Medicine

## 2017-05-18 ENCOUNTER — Encounter: Payer: Self-pay | Admitting: Family Medicine

## 2017-05-18 VITALS — BP 139/84 | HR 96 | Wt 291.0 lb

## 2017-05-18 DIAGNOSIS — L919 Hypertrophic disorder of the skin, unspecified: Secondary | ICD-10-CM | POA: Diagnosis not present

## 2017-05-18 DIAGNOSIS — L918 Other hypertrophic disorders of the skin: Secondary | ICD-10-CM | POA: Diagnosis not present

## 2017-05-18 NOTE — Progress Notes (Signed)
OFFICE VISIT  05/18/2017   CC:  Chief Complaint  Patient presents with  . Nevus    left side back   HPI:    Patient is a 37 y.o. Caucasian female who presents for "mole removal". Spot on L back has been present "as long as I can remember".  She is getting married and the lesion is showing above dress line/bra line in back so she wants it removed.   Past Medical History:  Diagnosis Date  . Anxiety    Saw a Psychiatrist (Dr. Orland Mustard) but not as of summer 2014.  Marland Kitchen Chronic neck pain   . Chronic tension headaches    (occipital) with medication overuse component  . Depression, major 02/2013  . Obesity, Class II, BMI 35-39.9   . Palpitations 01/11/2012  . Polycystic ovarian syndrome   . Tobacco dependence     Past Surgical History:  Procedure Laterality Date  . OVARIAN CYST REMOVAL  Age 80 yrs   Large  (Grapefruit size per pt)   Family History  Problem Relation Age of Onset  . Arthritis Mother        "rheumatoid" per pt report  . Diabetes Father   . Cancer Maternal Grandmother        breast     Outpatient Medications Prior to Visit  Medication Sig Dispense Refill  . clonazePAM (KLONOPIN) 1 MG tablet TAKE 1 TABLET BY MOUTH TWICE A DAY AS NEEDED FOR ANXIETY 60 tablet 0  . DULoxetine (CYMBALTA) 60 MG capsule Take 1 capsule (60 mg total) by mouth daily. 30 capsule 6  . metFORMIN (GLUCOPHAGE) 500 MG tablet Take 1 tablet (500 mg total) by mouth 2 (two) times daily with a meal. 60 tablet 6  . spironolactone (ALDACTONE) 50 MG tablet TAKE 1 TABLET (50 MG TOTAL) BY MOUTH DAILY. 30 tablet 6   No facility-administered medications prior to visit.     No Known Allergies  ROS As per HPI  PE: Blood pressure 139/84, pulse 96, weight 291 lb (132 kg), SpO2 96 %.  Exam chaperoned by Starla Link, CMA.  Gen: Alert, well appearing.  Patient is oriented to person, place, time, and situation. AFFECT: pleasant, lucid thought and speech. Left upper back/scapula region with 1 cm flesh  colored, soft papular lesion with stalk about 3 mm. No erythema, no tenderness.    LABS:    Chemistry      Component Value Date/Time   NA 137 07/21/2016 0901   K 4.5 07/21/2016 0901   CL 104 07/21/2016 0901   CO2 27 07/21/2016 0901   BUN 10 07/21/2016 0901   CREATININE 0.56 07/21/2016 0901   CREATININE 0.75 10/06/2015 1512      Component Value Date/Time   CALCIUM 9.1 07/21/2016 0901   ALKPHOS 73 07/21/2016 0901   AST 15 07/21/2016 0901   ALT 23 07/21/2016 0901   BILITOT 0.3 07/21/2016 0901       IMPRESSION AND PLAN:  Skin tag. Removed today using sterile procedure using dermablade (1 ml of lidoc 2% with epi used for local anesthesia). Pt tolerated procedure well, no immediate complications. Lesion sent to path. Wound care discussed.  An After Visit Summary was printed and given to the patient.  FOLLOW UP: Return for as needed.  Signed:  Crissie Sickles, MD           05/18/2017

## 2017-06-12 ENCOUNTER — Other Ambulatory Visit: Payer: Self-pay | Admitting: Family Medicine

## 2017-06-13 NOTE — Telephone Encounter (Signed)
LOV: 07/21/16, NOV: None  Will send in Rx for #30 w/ 0RF. Pt needs office visit for more refills.

## 2017-06-13 NOTE — Telephone Encounter (Signed)
Pt advised and voiced understanding. She stated that she will call back to schedule an apt with Dr. Anitra Lauth.

## 2017-07-11 ENCOUNTER — Ambulatory Visit: Payer: BLUE CROSS/BLUE SHIELD | Admitting: Family Medicine

## 2017-07-11 ENCOUNTER — Ambulatory Visit: Payer: Self-pay | Admitting: *Deleted

## 2017-07-11 ENCOUNTER — Other Ambulatory Visit: Payer: Self-pay | Admitting: Family Medicine

## 2017-07-11 ENCOUNTER — Encounter: Payer: Self-pay | Admitting: Family Medicine

## 2017-07-11 VITALS — BP 101/69 | HR 91 | Temp 99.2°F | Resp 16 | Ht 67.0 in | Wt 291.5 lb

## 2017-07-11 DIAGNOSIS — K2100 Gastro-esophageal reflux disease with esophagitis, without bleeding: Secondary | ICD-10-CM

## 2017-07-11 DIAGNOSIS — R072 Precordial pain: Secondary | ICD-10-CM | POA: Diagnosis not present

## 2017-07-11 DIAGNOSIS — K224 Dyskinesia of esophagus: Secondary | ICD-10-CM | POA: Diagnosis not present

## 2017-07-11 MED ORDER — PANTOPRAZOLE SODIUM 40 MG PO TBEC: 40.0000 mg | DELAYED_RELEASE_TABLET | Freq: Every day | ORAL | 3 refills | Status: DC

## 2017-07-11 NOTE — Telephone Encounter (Signed)
Pt reports chest pain x 2-3 nights. Mid to upper chest, 5/10, does not radiate. Denies SOB, dizziness, nausea, diaphoresis. States intermittent, duration an hour "or longer." Pt has taken Gas-X, Pepto Bismol, Aleve with "some relief." States "could be anxiety,"  occurs only at night. Same day appt made with Dr. Anitra Lauth. Care advise given per protocol; instructed to call back if symptoms worsen. Reason for Disposition . [1] Chest pain lasts > 5 minutes AND [2] occurred > 3 days ago (72 hours) AND [3] NO chest pain or cardiac symptoms now  Protocols used: CHEST PAIN-A-AH

## 2017-07-11 NOTE — Telephone Encounter (Signed)
   Answer Assessment - Initial Assessment Questions 1. LOCATION: "Where does it hurt?"       Middle 2. RADIATION: "Does the pain go anywhere else?" (e.g., into neck, jaw, arms, back)     no 3. ONSET: "When did the chest pain begin?" (Minutes, hours or days)      2 nights ago 4. PATTERN "Does the pain come and go, or has it been constant since it started?"  "Does it get worse with exertion?"      Comes and goes 5. DURATION: "How long does it last" (e.g., seconds, minutes, hours)     Few hours 6. SEVERITY: "How bad is the pain?"  (e.g., Scale 1-10; mild, moderate, or severe)    - MILD (1-3): doesn't interfere with normal activities     - MODERATE (4-7): interferes with normal activities or awakens from sleep    - SEVERE (8-10): excruciating pain, unable to do any normal activities       5/10 7. CARDIAC RISK FACTORS: "Do you have any history of heart problems or risk factors for heart disease?" (e.g., prior heart attack, angina; high blood pressure, diabetes, being overweight, high cholesterol, smoking, or strong family history of heart disease)     no 8. PULMONARY RISK FACTORS: "Do you have any history of lung disease?"  (e.g., blood clots in lung, asthma, emphysema, birth control pills)     no 9. CAUSE: "What do you think is causing the chest pain?"     *No Answer* 10. OTHER SYMPTOMS: "Do you have any other symptoms?" (e.g., dizziness, nausea, vomiting, sweating, fever, difficulty breathing, cough)       no 11. PREGNANCY: "Is there any chance you are pregnant?" "When was your last menstrual period?"       Possibly; last menses less than 1 month ago  Protocols used: CHEST PAIN-A-AH

## 2017-07-11 NOTE — Telephone Encounter (Signed)
Noted  

## 2017-07-11 NOTE — Telephone Encounter (Signed)
[facility]  RF request for duloxetine LOV: 07/21/16 Next ov: Today - 07/11/17 Last written: 06/13/17 #30 w/ 0RF  Please advise. Thanks.

## 2017-07-11 NOTE — Patient Instructions (Signed)
Take generic over the counter zantac 150 mg every night for 7 days.  Take the pantoprazole tab I prescribed every morning for 1 mo and recheck with me at that time.

## 2017-07-11 NOTE — Progress Notes (Signed)
OFFICE VISIT  07/11/2017   CC:  Chief Complaint  Patient presents with  . Chest Pain    Anxiety?     HPI:    Patient is a 37 y.o. Caucasian female who presents for chest pains.  Dull pressure and tightness felt in central sternum on 2 occasions the last 2 days.  Last night it woke her from sleep, other time it was early morning after waking up.  Lasts a few hours.  No classic acid reflux has been felt, no upper abd pain. Tums, pepto, gas-ex---none of this helps.  Nothing made it worse.  She felt very anxious---much more than usual.  Got upset, was crying.  No palpitations, no SOB, no radiation of the pain, no diaphoresis.   She is always anxious. She got the pain to go away with hot shower today.   She is out of clonazepam for the last few months.  Compliant with duloxetine 60mg .  She feels like her anxiety is getting worse. She did get married recently, new things happening regarding home ownership, she and husband are trying to have a baby. LMP less than a month ago.  ROS: no fevers, no URI/ST/cough.  No LE swelling.  Past Medical History:  Diagnosis Date  . Anxiety    Saw a Psychiatrist (Dr. Orland Mustard) but not as of summer 2014.  Marland Kitchen Chronic neck pain   . Chronic tension headaches    (occipital) with medication overuse component  . Depression, major 02/2013  . Obesity, Class II, BMI 35-39.9   . Palpitations 01/11/2012  . Polycystic ovarian syndrome   . Tobacco dependence     Past Surgical History:  Procedure Laterality Date  . OVARIAN CYST REMOVAL  Age 14 yrs   Large  (Grapefruit size per pt)    Outpatient Medications Prior to Visit  Medication Sig Dispense Refill  . metFORMIN (GLUCOPHAGE) 500 MG tablet Take 1 tablet (500 mg total) by mouth 2 (two) times daily with a meal. 60 tablet 6  . spironolactone (ALDACTONE) 50 MG tablet TAKE 1 TABLET (50 MG TOTAL) BY MOUTH DAILY. 30 tablet 6  . DULoxetine (CYMBALTA) 60 MG capsule TAKE 1 CAPSULE (60 MG TOTAL) BY MOUTH DAILY. 30  capsule 0  . clonazePAM (KLONOPIN) 1 MG tablet TAKE 1 TABLET BY MOUTH TWICE A DAY AS NEEDED FOR ANXIETY (Patient not taking: Reported on 07/11/2017) 60 tablet 0   No facility-administered medications prior to visit.     No Known Allergies  ROS As per HPI  PE: Blood pressure 101/69, pulse 91, temperature 99.2 F (37.3 C), temperature source Oral, resp. rate 16, height 5\' 7"  (1.702 m), weight 291 lb 8 oz (132.2 kg), SpO2 96 %. Exam chaperoned by Starla Link, CMA. Gen: Alert, well appearing.  Patient is oriented to person, place, time, and situation. AFFECT: pleasant but anxious, lucid thought and speech. CV: RRR, no m/r/g.   LUNGS: CTA bilat, nonlabored resps, good aeration in all lung fields. No chest wall tenderness. ABD: no tenderness. EXT: no clubbing, cyanosis, or edema.     LABS:    Chemistry      Component Value Date/Time   NA 137 07/21/2016 0901   K 4.5 07/21/2016 0901   CL 104 07/21/2016 0901   CO2 27 07/21/2016 0901   BUN 10 07/21/2016 0901   CREATININE 0.56 07/21/2016 0901   CREATININE 0.75 10/06/2015 1512      Component Value Date/Time   CALCIUM 9.1 07/21/2016 0901   ALKPHOS 73 07/21/2016 0901  AST 15 07/21/2016 0901   ALT 23 07/21/2016 0901   BILITOT 0.3 07/21/2016 0901      IMPRESSION AND PLAN:  1) Precordial CP: low suspicion of cardiopulmonary or infectious cause. Suspect more of a combo of silent GERD with esoph spasm + worsening chronic anxiety. No new anxiety med at this time (no benzo) due to patient and husband trying to conceive. Will start pantoprazole 40mg  qAM x 61mo, also take zantac 150mg  qhs for the next 1 week. If no improvement with these things over the next month, next step likely eval for hiatal hernia +/- GI referral.  Of note, she has stopped her aldactone and metformin already.  Spent 25 min with pt today, with >50% of this time spent in counseling and care coordination regarding the above problems.  An After Visit Summary was  printed and given to the patient.  FOLLOW UP: Return in about 1 month (around 08/08/2017) for f/u atypical CP/GERD.  Signed:  Crissie Sickles, MD           07/11/2017

## 2017-08-05 ENCOUNTER — Encounter: Payer: Self-pay | Admitting: Family Medicine

## 2017-08-05 ENCOUNTER — Ambulatory Visit: Payer: BLUE CROSS/BLUE SHIELD | Admitting: Family Medicine

## 2017-08-05 VITALS — BP 110/74 | HR 83 | Temp 98.9°F | Resp 16 | Ht 67.0 in | Wt 290.2 lb

## 2017-08-05 DIAGNOSIS — R05 Cough: Secondary | ICD-10-CM | POA: Diagnosis not present

## 2017-08-05 DIAGNOSIS — R5081 Fever presenting with conditions classified elsewhere: Secondary | ICD-10-CM

## 2017-08-05 DIAGNOSIS — J029 Acute pharyngitis, unspecified: Secondary | ICD-10-CM

## 2017-08-05 DIAGNOSIS — R6889 Other general symptoms and signs: Secondary | ICD-10-CM | POA: Diagnosis not present

## 2017-08-05 DIAGNOSIS — J069 Acute upper respiratory infection, unspecified: Secondary | ICD-10-CM

## 2017-08-05 DIAGNOSIS — R059 Cough, unspecified: Secondary | ICD-10-CM

## 2017-08-05 LAB — POCT RAPID STREP A (OFFICE): RAPID STREP A SCREEN: NEGATIVE

## 2017-08-05 LAB — POC INFLUENZA A&B (BINAX/QUICKVUE)
Influenza A, POC: NEGATIVE
Influenza B, POC: NEGATIVE

## 2017-08-05 MED ORDER — ALBUTEROL SULFATE HFA 108 (90 BASE) MCG/ACT IN AERS: 2.0000 | INHALATION_SPRAY | Freq: Four times a day (QID) | RESPIRATORY_TRACT | 0 refills | Status: DC | PRN

## 2017-08-05 NOTE — Patient Instructions (Signed)
I sent in a new albuterol inhaler to your pharmacy. Keep taking advil cold/cough. You may add PLAIN robitussin OR plain mucinex as instructed on packaging.

## 2017-08-05 NOTE — Progress Notes (Signed)
OFFICE VISIT  08/05/2017   CC:  Chief Complaint  Patient presents with  . URI   HPI:    Patient is a 37 y.o. Caucasian female who presents for respiratory symptoms. Onset 3 d/a, ST and runny nose, sneezing, coughing, Tm 101.3--most recently last night. +Malaise.  Diffuse achy body.  No n/v/d.  Eating and drinking well. Husband had same sx's prior to her. NO HA.  No pain in face or upper teeth. Feels SOB and wheezing.  Has an albuterol inhaler at home from a past similar illness but says it was empty. Still smoking. Taking advil cold/sinus and mucinex.  Past Medical History:  Diagnosis Date  . Anxiety    Saw a Psychiatrist (Dr. Orland Mustard) but not as of summer 2014.  Marland Kitchen Chronic neck pain   . Chronic tension headaches    (occipital) with medication overuse component  . Depression, major 02/2013  . Obesity, Class II, BMI 35-39.9   . Palpitations 01/11/2012  . Polycystic ovarian syndrome   . Tobacco dependence     Past Surgical History:  Procedure Laterality Date  . OVARIAN CYST REMOVAL  Age 31 yrs   Large  (Grapefruit size per pt)    Outpatient Medications Prior to Visit  Medication Sig Dispense Refill  . DULoxetine (CYMBALTA) 60 MG capsule TAKE 1 CAPSULE BY MOUTH EVERY DAY 30 capsule 5  . pantoprazole (PROTONIX) 40 MG tablet Take 1 tablet (40 mg total) by mouth daily. 30 tablet 3   No facility-administered medications prior to visit.     No Known Allergies  ROS As per HPI  PE: Blood pressure 110/74, pulse 83, temperature 98.9 F (37.2 C), temperature source Oral, resp. rate 16, height 5\' 7"  (1.702 m), weight 290 lb 4 oz (131.7 kg), SpO2 97 %. VS: noted--normal. Gen: alert, NAD, NONTOXIC APPEARING. HEENT: eyes without injection, drainage, or swelling.  Ears: EACs clear, TMs with normal light reflex and landmarks.  Nose: Clear rhinorrhea, with some dried, crusty exudate adherent to mildly injected mucosa.  No purulent d/c.  No paranasal sinus TTP.  No facial swelling.   Throat and mouth without focal lesion.  No pharyngial swelling.  Minimal soft palate and posterior pharyngeal erythema.  No asymmetry.  No exudate.   Neck: supple, no LAD.  No anterior neck tenderness or swelling. LUNGS: CTA bilat, nonlabored resps.   CV: RRR, no m/r/g. EXT: no c/c/e SKIN: no rash  LABS:    Chemistry      Component Value Date/Time   NA 137 07/21/2016 0901   K 4.5 07/21/2016 0901   CL 104 07/21/2016 0901   CO2 27 07/21/2016 0901   BUN 10 07/21/2016 0901   CREATININE 0.56 07/21/2016 0901   CREATININE 0.75 10/06/2015 1512      Component Value Date/Time   CALCIUM 9.1 07/21/2016 0901   ALKPHOS 73 07/21/2016 0901   AST 15 07/21/2016 0901   ALT 23 07/21/2016 0901   BILITOT 0.3 07/21/2016 0901     Rapid strep NEG Rapid flu A/B neg  IMPRESSION AND PLAN:  URI with ST/cough/fever---mild influenza-like symptoms.  She describes a mild RAD component but none detected on exam today. Strep and flu testing neg. Discussed option of checking CXR today for pneumonia (fever x 3d) but decided to NOT do this and just take a watchful waiting approach and treat symptomatically with albuterol HFA.  Further instructions: I sent in a new albuterol inhaler to your pharmacy. Keep taking advil cold/cough. You may add PLAIN robitussin  OR plain mucinex as instructed on packaging.  Signs/symptoms to call or return for were reviewed and pt expressed understanding.  FOLLOW UP: Return if symptoms worsen or fail to improve.  Signed:  Crissie Sickles, MD           08/05/2017

## 2017-09-05 ENCOUNTER — Ambulatory Visit: Payer: BLUE CROSS/BLUE SHIELD | Admitting: Family Medicine

## 2017-09-05 ENCOUNTER — Encounter: Payer: Self-pay | Admitting: Family Medicine

## 2017-09-05 VITALS — BP 117/83 | HR 90 | Temp 98.8°F | Resp 20 | Ht 67.0 in | Wt 291.0 lb

## 2017-09-05 DIAGNOSIS — R062 Wheezing: Secondary | ICD-10-CM | POA: Diagnosis not present

## 2017-09-05 DIAGNOSIS — J40 Bronchitis, not specified as acute or chronic: Secondary | ICD-10-CM

## 2017-09-05 MED ORDER — AZITHROMYCIN 250 MG PO TABS: ORAL_TABLET | ORAL | 0 refills | Status: DC

## 2017-09-05 MED ORDER — BENZONATATE 200 MG PO CAPS: 200.0000 mg | ORAL_CAPSULE | Freq: Two times a day (BID) | ORAL | 0 refills | Status: DC | PRN

## 2017-09-05 MED ORDER — PREDNISONE 50 MG PO TABS: 50.0000 mg | ORAL_TABLET | Freq: Every day | ORAL | 0 refills | Status: DC

## 2017-09-05 NOTE — Progress Notes (Signed)
Carrie Blanchard , [birth year], 37 y.o., female MRN: [id number] Patient Care Team    Relationship Specialty Notifications Start End  Tammi Sou, MD PCP - General Family Medicine  11/01/11   Pieter Partridge, [facility]  07/20/13     Chief Complaint  Patient presents with  . URI    cough,congestion,wheezing x 2 days     Subjective: Pt presents for an OV with complaints of cough  of 3 days duration.  Associated symptoms include congestion, wheezing, runny nose, fever, chills, vomit with cough. She is a Smoker. She does not have a h/o asthma. No known sick contacts.  Pt has tried benadryl, mucinex and albuterol inhaler every 4 hours to ease their symptoms.   Depression screen PHQ 2/9 07/21/2016  Decreased Interest 3  Down, Depressed, Hopeless 3  PHQ - 2 Score 6  Altered sleeping 3  Tired, decreased energy 3  Change in appetite 3  Feeling bad or failure about yourself  3  Trouble concentrating 3  Moving slowly or fidgety/restless 3  Suicidal thoughts 1  PHQ-9 Score 25    No Known Allergies Social History   Tobacco Use  . Smoking status: Current Every Day Smoker    Packs/day: 0.50    Types: Cigarettes  . Smokeless tobacco: Never Used  Substance Use Topics  . Alcohol use: Yes   Past Medical History:  Diagnosis Date  . Anxiety    Saw a Psychiatrist (Dr. Orland Mustard) but not as of summer 2014.  Marland Kitchen Chronic neck pain   . Chronic tension headaches    (occipital) with medication overuse component  . Depression, major 02/2013  . Obesity, Class II, BMI 35-39.9   . Palpitations 01/11/2012  . Polycystic ovarian syndrome   . Tobacco dependence    Past Surgical History:  Procedure Laterality Date  . OVARIAN CYST REMOVAL  Age 9 yrs   Large  (Grapefruit size per pt)   Family History  Problem Relation Age of Onset  . Arthritis Mother        "rheumatoid" per pt report  . Diabetes Father   . Cancer Maternal Grandmother        breast   Allergies as of  09/05/2017   No Known Allergies     Medication List        Accurate as of 09/05/17 10:58 AM. Always use your most recent med list.          albuterol 108 (90 Base) MCG/ACT inhaler Commonly known as:  VENTOLIN HFA Inhale 2 puffs into the lungs every 6 (six) hours as needed for wheezing or shortness of breath.   DULoxetine 60 MG capsule Commonly known as:  CYMBALTA TAKE 1 CAPSULE BY MOUTH EVERY DAY   pantoprazole 40 MG tablet Commonly known as:  PROTONIX Take 1 tablet (40 mg total) by mouth daily.       All past medical history, surgical history, allergies, family history, immunizations andmedications were updated in the EMR today and reviewed under the history and medication portions of their EMR.     ROS: Negative, with the exception of above mentioned in HPI   Objective:  BP 117/83 (BP Location: Left Arm, Patient Position: Sitting, Cuff Size: Large)   Pulse 90   Temp 98.8 F (37.1 C)   Resp 20   Ht 5\' 7"  (1.702 m)   Wt 291 lb (132 kg)   LMP 08/22/2017   SpO2 96%   BMI 45.58 kg/m  Body mass index is 45.58 kg/m. Gen: Afebrile. No acute distress. Nontoxic in appearance, well developed, well nourished.  HENT: AT. [place]. Bilateral TM visualized with mild fullness. MMM, no oral lesions. Bilateral nares erythema and drainage. Throat without erythema or exudates. Cough present.  Eyes:Pupils Equal Round Reactive to light, Extraocular movements intact,  Conjunctiva without redness, discharge or icterus. Neck/lymp/endocrine: Supple,no lymphadenopathy CV: RRR  Chest: bilateral diffuse wheezing. No crackles. Good air movement Abd: Soft. NTND. BS present. no Masses palpated.  Skin: no rashes, purpura or petechiae.  Neuro:  Normal gait. PERLA. EOMi. Alert. Oriented x3   No exam data present No results found. No results found for this or any previous visit (from the past 24 hour(s)).  Assessment/Plan: Carrie Blanchard is a 36 y.o. female present for OV for    Wheezing/Bronchitis Rest, hydrate.  +/- flonase, mucinex (DM if cough), nettie pot or nasal saline.  z-pack, prednisone prescribed, take until completed.  tessalon perles for cough Daily OTC antihistamine of choice If cough present it can last up to 6-8 weeks.  - duoneb tx in OV. Improved wheezing.  - albuterol every 4 hrs as needed.  - encouraged to stop smoking.  - F/U 1-2 weeks if not improving, sooner if worsening.   Reviewed expectations re: course of current medical issues.  Discussed self-management of symptoms.  Outlined signs and symptoms indicating need for more acute intervention.  Patient verbalized understanding and all questions were answered.  Patient received an After-Visit Summary. > 25 minutes spent with patient, >50% of time spent face to face counseling      No orders of the defined types were placed in this encounter.    Note is dictated utilizing voice recognition software. Although note has been proof read prior to signing, occasional typographical errors still can be missed. If any questions arise, please do not hesitate to call for verification.   electronically signed by:  Howard Pouch, DO  [place]

## 2017-09-05 NOTE — Patient Instructions (Addendum)
Rest, hydrate.  + flonase, mucinex (DM if cough), nettie pot or nasal saline.  z-pack, prednisone prescribed, take until completed.  Tessalon perles for cough Daily OTC antihistamine of choice If cough present it can last up to 6-8 weeks.  - albuterol every 4 hrs as needed.  - I strongly encourage you to stop smoking.  - F/U 1-2 weeks if not improving, sooner if worsening.     Acute Bronchitis, Adult Acute bronchitis is when air tubes (bronchi) in the lungs suddenly get swollen. The condition can make it hard to breathe. It can also cause these symptoms:  A cough.  Coughing up clear, yellow, or green mucus.  Wheezing.  Chest congestion.  Shortness of breath.  A fever.  Body aches.  Chills.  A sore throat.  Follow these instructions at home: Medicines  Take over-the-counter and prescription medicines only as told by your doctor.  If you were prescribed an antibiotic medicine, take it as told by your doctor. Do not stop taking the antibiotic even if you start to feel better. General instructions  Rest.  Drink enough fluids to keep your pee (urine) clear or pale yellow.  Avoid smoking and secondhand smoke. If you smoke and you need help quitting, ask your doctor. Quitting will help your lungs heal faster.  Use an inhaler, cool mist vaporizer, or humidifier as told by your doctor.  Keep all follow-up visits as told by your doctor. This is important. How is this prevented? To lower your risk of getting this condition again:  Wash your hands often with soap and water. If you cannot use soap and water, use hand sanitizer.  Avoid contact with people who have cold symptoms.  Try not to touch your hands to your mouth, nose, or eyes.  Make sure to get the flu shot every year.  Contact a doctor if:  Your symptoms do not get better in 2 weeks. Get help right away if:  You cough up blood.  You have chest pain.  You have very bad shortness of breath.  You  become dehydrated.  You faint (pass out) or keep feeling like you are going to pass out.  You keep throwing up (vomiting).  You have a very bad headache.  Your fever or chills gets worse. This information is not intended to replace advice given to you by your health care provider. Make sure you discuss any questions you have with your health care provider. Document Released: 09/08/2007 Document Revised: 10/29/2015 Document Reviewed: 09/10/2015 Elsevier Interactive Patient Education  [employer].

## 2017-09-07 ENCOUNTER — Telehealth: Payer: Self-pay | Admitting: *Deleted

## 2017-09-07 NOTE — Telephone Encounter (Signed)
Copied from [place] [id number]. Topic: Inquiry >> Sep 07, 2017 11:00 AM Scherrie Gerlach wrote: Reason for CRM: pt was seen Monday by Dr Raoul Pitch. Pt was still not feeling well enough to return to work today, and since this is the 3rd day, pt needs an work note. Please fax to:  [phone] Attn: Jethro Bastos

## 2017-09-07 NOTE — Telephone Encounter (Signed)
Ok to cover for 3 days total for acute illness. No additional days can be covered for an acute illness. Hope she is feeling better

## 2017-09-07 NOTE — Telephone Encounter (Signed)
Note completed and faxed to requested number

## 2017-09-16 ENCOUNTER — Telehealth: Payer: Self-pay | Admitting: Family Medicine

## 2017-09-16 MED ORDER — FLUCONAZOLE 150 MG PO TABS: 150.0000 mg | ORAL_TABLET | Freq: Once | ORAL | 0 refills | Status: AC

## 2017-09-16 NOTE — Telephone Encounter (Signed)
Left message advising that Rx has been sent to her pharmacy.

## 2017-09-16 NOTE — Telephone Encounter (Signed)
Please advise. Thanks.  

## 2017-09-16 NOTE — Telephone Encounter (Signed)
Copied from [place] [id number]. Topic: Quick Communication - See Telephone Encounter >> Sep 16, 2017 12:07 PM Neva Seat wrote: Needing medication for yeast infection from taking the antibiotic prescribed recently.

## 2017-09-16 NOTE — Telephone Encounter (Signed)
Fluconazole eRx'd.

## 2017-09-20 DIAGNOSIS — F17200 Nicotine dependence, unspecified, uncomplicated: Secondary | ICD-10-CM | POA: Diagnosis not present

## 2017-09-20 DIAGNOSIS — Z01419 Encounter for gynecological examination (general) (routine) without abnormal findings: Secondary | ICD-10-CM | POA: Diagnosis not present

## 2017-09-20 DIAGNOSIS — Z1151 Encounter for screening for human papillomavirus (HPV): Secondary | ICD-10-CM | POA: Diagnosis not present

## 2017-09-20 DIAGNOSIS — Z3169 Encounter for other general counseling and advice on procreation: Secondary | ICD-10-CM | POA: Diagnosis not present

## 2017-09-20 DIAGNOSIS — E282 Polycystic ovarian syndrome: Secondary | ICD-10-CM | POA: Diagnosis not present

## 2017-09-20 DIAGNOSIS — Z6841 Body Mass Index (BMI) 40.0 and over, adult: Secondary | ICD-10-CM | POA: Diagnosis not present

## 2017-09-27 DIAGNOSIS — Z3169 Encounter for other general counseling and advice on procreation: Secondary | ICD-10-CM | POA: Diagnosis not present

## 2017-09-27 DIAGNOSIS — E282 Polycystic ovarian syndrome: Secondary | ICD-10-CM | POA: Diagnosis not present

## 2017-10-19 DIAGNOSIS — E282 Polycystic ovarian syndrome: Secondary | ICD-10-CM | POA: Diagnosis not present

## 2017-10-19 DIAGNOSIS — Z6841 Body Mass Index (BMI) 40.0 and over, adult: Secondary | ICD-10-CM | POA: Diagnosis not present

## 2017-10-19 DIAGNOSIS — Z3169 Encounter for other general counseling and advice on procreation: Secondary | ICD-10-CM | POA: Diagnosis not present

## 2017-10-19 DIAGNOSIS — E669 Obesity, unspecified: Secondary | ICD-10-CM | POA: Diagnosis not present

## 2017-11-11 DIAGNOSIS — E876 Hypokalemia: Secondary | ICD-10-CM | POA: Diagnosis not present

## 2017-11-11 DIAGNOSIS — K5792 Diverticulitis of intestine, part unspecified, without perforation or abscess without bleeding: Secondary | ICD-10-CM | POA: Diagnosis not present

## 2017-11-11 DIAGNOSIS — R1032 Left lower quadrant pain: Secondary | ICD-10-CM | POA: Diagnosis not present

## 2017-11-18 DIAGNOSIS — Z6841 Body Mass Index (BMI) 40.0 and over, adult: Secondary | ICD-10-CM | POA: Diagnosis not present

## 2017-11-18 DIAGNOSIS — E669 Obesity, unspecified: Secondary | ICD-10-CM | POA: Diagnosis not present

## 2018-01-22 ENCOUNTER — Other Ambulatory Visit: Payer: Self-pay | Admitting: Family Medicine

## 2018-01-23 DIAGNOSIS — E669 Obesity, unspecified: Secondary | ICD-10-CM | POA: Diagnosis not present

## 2018-01-23 DIAGNOSIS — Z6841 Body Mass Index (BMI) 40.0 and over, adult: Secondary | ICD-10-CM | POA: Diagnosis not present

## 2018-01-23 DIAGNOSIS — N970 Female infertility associated with anovulation: Secondary | ICD-10-CM | POA: Diagnosis not present

## 2018-01-24 ENCOUNTER — Telehealth: Payer: Self-pay | Admitting: *Deleted

## 2018-01-24 ENCOUNTER — Other Ambulatory Visit: Payer: Self-pay | Admitting: Family Medicine

## 2018-01-24 DIAGNOSIS — Z0184 Encounter for antibody response examination: Secondary | ICD-10-CM

## 2018-01-24 NOTE — Telephone Encounter (Signed)
I recommend she get her varicella antibody level checked to make sure she is not already immune.  If this test shows she is not immune, then she can get varicella vaccine. I'll put order in.

## 2018-01-24 NOTE — Telephone Encounter (Signed)
I checked [facility], no records there.   Please advise. Thanks.

## 2018-01-24 NOTE — Telephone Encounter (Signed)
Copied from [place] [id number]. Topic: General - Other >> Jan 24, 2018 10:36 AM Carrie Blanchard wrote: Reason for CRM: Patient states that her obgyn is requesting her to have a chicken pox vaccine. She said she never had it as a child and is trying to get pregnant. She has never had the chicken pox. Please contact patient.

## 2018-01-24 NOTE — Telephone Encounter (Signed)
Pt advised and voiced understanding.    Lab apt made for Friday 01/27/18 at 4:00pm.

## 2018-01-27 ENCOUNTER — Other Ambulatory Visit: Payer: BLUE CROSS/BLUE SHIELD

## 2018-01-31 NOTE — Telephone Encounter (Signed)
Pt advised and voiced understanding.    Apt made for Friday 02/03/18 at 3:15pm.

## 2018-02-03 ENCOUNTER — Ambulatory Visit: Payer: BLUE CROSS/BLUE SHIELD | Admitting: Family Medicine

## 2018-02-03 ENCOUNTER — Other Ambulatory Visit (INDEPENDENT_AMBULATORY_CARE_PROVIDER_SITE_OTHER): Payer: BLUE CROSS/BLUE SHIELD

## 2018-02-03 DIAGNOSIS — Z0184 Encounter for antibody response examination: Secondary | ICD-10-CM | POA: Diagnosis not present

## 2018-02-06 LAB — VARICELLA ZOSTER ANTIBODY, IGG: Varicella IgG: <135 index — ABNORMAL LOW

## 2018-02-15 ENCOUNTER — Ambulatory Visit: Payer: BLUE CROSS/BLUE SHIELD

## 2018-02-20 DIAGNOSIS — Z3149 Encounter for other procreative investigation and testing: Secondary | ICD-10-CM | POA: Diagnosis not present

## 2018-02-21 ENCOUNTER — Ambulatory Visit: Payer: BLUE CROSS/BLUE SHIELD

## 2018-02-25 ENCOUNTER — Other Ambulatory Visit: Payer: Self-pay | Admitting: Family Medicine

## 2018-02-27 NOTE — Telephone Encounter (Signed)
Will send Rx for duloxetine #30 w/ 0RF. No further refills will be given. Pt has already been advised once that she needs to f/u and apt did not f/u.  Will send #30 instead for #14 due to holidays.   Left message advising pt that she needs to schedule a follow up with Dr. Anitra Lauth so we can continue to refill her medications.

## 2018-03-06 NOTE — Telephone Encounter (Signed)
Pt advised and voiced understanding.    Apt made for 03/16/18 at 3:30pm.

## 2018-03-16 ENCOUNTER — Ambulatory Visit (INDEPENDENT_AMBULATORY_CARE_PROVIDER_SITE_OTHER): Payer: BLUE CROSS/BLUE SHIELD

## 2018-03-16 ENCOUNTER — Ambulatory Visit: Payer: BLUE CROSS/BLUE SHIELD

## 2018-03-16 DIAGNOSIS — Z23 Encounter for immunization: Secondary | ICD-10-CM

## 2018-03-16 NOTE — Progress Notes (Signed)
Pt came in office today for a varicella vaccine, given in Lt deltoid. Vaccine given an pt tolerated the vaccine well with no side effects noted.

## 2018-03-20 DIAGNOSIS — E282 Polycystic ovarian syndrome: Secondary | ICD-10-CM | POA: Diagnosis not present

## 2018-03-20 DIAGNOSIS — Z3149 Encounter for other procreative investigation and testing: Secondary | ICD-10-CM | POA: Diagnosis not present

## 2018-03-22 ENCOUNTER — Other Ambulatory Visit: Payer: Self-pay | Admitting: Family Medicine

## 2018-04-03 DIAGNOSIS — F329 Major depressive disorder, single episode, unspecified: Secondary | ICD-10-CM | POA: Diagnosis not present

## 2018-04-03 DIAGNOSIS — F419 Anxiety disorder, unspecified: Secondary | ICD-10-CM | POA: Diagnosis not present

## 2018-04-03 DIAGNOSIS — E282 Polycystic ovarian syndrome: Secondary | ICD-10-CM | POA: Diagnosis not present

## 2018-04-03 DIAGNOSIS — N970 Female infertility associated with anovulation: Secondary | ICD-10-CM | POA: Diagnosis not present

## 2018-04-06 ENCOUNTER — Encounter: Payer: Self-pay | Admitting: Family Medicine

## 2018-04-06 ENCOUNTER — Ambulatory Visit: Payer: BLUE CROSS/BLUE SHIELD | Admitting: Family Medicine

## 2018-04-06 ENCOUNTER — Encounter: Payer: Self-pay | Admitting: *Deleted

## 2018-04-06 ENCOUNTER — Ambulatory Visit: Payer: Self-pay

## 2018-04-06 VITALS — BP 104/73 | HR 86 | Temp 98.2°F | Resp 16 | Ht 67.0 in | Wt 284.0 lb

## 2018-04-06 DIAGNOSIS — R072 Precordial pain: Secondary | ICD-10-CM

## 2018-04-06 DIAGNOSIS — K2100 Gastro-esophageal reflux disease with esophagitis, without bleeding: Secondary | ICD-10-CM

## 2018-04-06 MED ORDER — FAMOTIDINE 40 MG PO TABS: ORAL_TABLET | ORAL | 2 refills | Status: DC

## 2018-04-06 NOTE — Telephone Encounter (Signed)
Please advise. Thanks.  

## 2018-04-06 NOTE — Telephone Encounter (Signed)
Pt c/o pressure to the center of her chest beginning at 5:00 am today. Pt stated that she tried taking a shower, taking a Buspar and pain persists. Pt stated pain is contsant and moderate in intensity. Pt stated she took a pantoprazole this morning but has been out of Zantac. Pt satted that she has been seen in the office for this before. Pt was seen 07/11/17 for similar chest pain and was diagnosed with GERD.  Per protocol advised pt to go to ED. Pt refused. Care advice given and pt verbalized understanding except she wants an appointment instead of going to the ED. Routing note high priority to provider for review.   Reason for Disposition . Chest pain lasts > 5 minutes (Exceptions: chest pain occurring > 3 days ago and now asymptomatic; same as previously diagnosed heartburn and has accompanying sour taste in mouth)  Answer Assessment - Initial Assessment Questions 1. LOCATION: "Where does it hurt?"       Center of chest- pressure 2. RADIATION: "Does the pain go anywhere else?" (e.g., into neck, jaw, arms, back)     no 3. ONSET: "When did the chest pain begin?" (Minutes, hours or days)      Started at 5:00 am today 4. PATTERN "Does the pain come and go, or has it been constant since it started?"  "Does it get worse with exertion?"      Constant-no 5. DURATION: "How long does it last" (e.g., seconds, minutes, hours)     constant 6. SEVERITY: "How bad is the pain?"  (e.g., Scale 1-10; mild, moderate, or severe)    - MILD (1-3): doesn't interfere with normal activities     - MODERATE (4-7): interferes with normal activities or awakens from sleep    - SEVERE (8-10): excruciating pain, unable to do any normal activities       moderate 7. CARDIAC RISK FACTORS: "Do you have any history of heart problems or risk factors for heart disease?" (e.g., prior heart attack, angina; high blood pressure, diabetes, being overweight, high cholesterol, smoking, or strong family history of heart disease)  Overweight, smoking 8. PULMONARY RISK FACTORS: "Do you have any history of lung disease?"  (e.g., blood clots in lung, asthma, emphysema, birth control pills)     no 9. CAUSE: "What do you think is causing the chest pain?"     Pt doesn't know 10. OTHER SYMPTOMS: "Do you have any other symptoms?" (e.g., dizziness, nausea, vomiting, sweating, fever, difficulty breathing, cough)       no 11. PREGNANCY: "Is there any chance you are pregnant?" "When was your last menstrual period?"       Pt is trying possibly pregnant- LMP: 2 weeks ago  Protocols used: CHEST PAIN-A-AH

## 2018-04-06 NOTE — Telephone Encounter (Signed)
OK for appt today but needs 30 min. -thx

## 2018-04-06 NOTE — Telephone Encounter (Signed)
Patient scheduled at 3:30pm.

## 2018-04-06 NOTE — Patient Instructions (Signed)
Pepcid:  Take 1 tab twice a day for 7 days, then take 1 tab twice a day as needed for heartburn/reflux.

## 2018-04-06 NOTE — Progress Notes (Signed)
OFFICE VISIT  04/06/2018   CC:  Chief Complaint  Patient presents with  . Chest Pain    stared this morning, woke up w these sxs around 5am but they have now stopped   HPI:    Patient is a 38 y.o. Caucasian female who presents for chest pain. Woke up around 5 AM this morning with central substernal chest pain and pressure.  Not sharp.  Dull and constant. No associated SOB, palpitations, nausea, or diaphoresis.  She states it feels like an anxiety symptom.  She states it does not feel cardiac or abdomen related.  No recent GI infxn or resp infx sx's.  It lasted a few hours and gradually eased off completely.  Has not come back since.  No regurg, no ST, no cough, no feeling of anything in back of throat. She ate a bacon cheeseburger and fries + diet coke around 9 PM the night prior.  Felt heartburn later in the evening so she took some rolaids. She has been off the pantoprazole for several months and has not had to return to using it b/c she had felt no significant problems.  Buspar recently rx'd by her GYN due to anxiety that she felt was induced by femara that she is rx'd for infertility. UC visit in [place] (records not available but pt reports EKG normal, x-ray of abd, blood work--->all unremarkable except low potassium level) for same CP around end of May 2019.  Pt states she was dx'd with diverticulitis at that time and was rx'd abx. She did not have any further problems in the next few months until today.  Pt does not snore, nor does she have apneic spells in sleep. No recent LE swelling. No recent prolonged immobilization, no recent surgical procedure, no hx of DVT.   Past Medical History:  Diagnosis Date  . Anxiety    Saw a Psychiatrist (Dr. Orland Mustard) but not as of summer 2014.  Marland Kitchen Chronic neck pain   . Chronic tension headaches    (occipital) with medication overuse component  . Depression, major 02/2013  . GERD (gastroesophageal reflux disease)    "silent" for the most part  .  Obesity, Class II, BMI 35-39.9   . Palpitations 01/11/2012  . Polycystic ovarian syndrome   . Tobacco dependence     Past Surgical History:  Procedure Laterality Date  . OVARIAN CYST REMOVAL  Age 21 yrs   Large  (Grapefruit size per pt)    Outpatient Medications Prior to Visit  Medication Sig Dispense Refill  . albuterol (VENTOLIN HFA) 108 (90 Base) MCG/ACT inhaler Inhale 2 puffs into the lungs every 6 (six) hours as needed for wheezing or shortness of breath. 1 Inhaler 0  . azithromycin (ZITHROMAX) 250 MG tablet 500 mg day 1, then 250 mg QD 6 tablet 0  . benzonatate (TESSALON) 200 MG capsule Take 1 capsule (200 mg total) by mouth 2 (two) times daily as needed for cough. 20 capsule 0  . busPIRone (BUSPAR) 5 MG tablet Take 5 mg by mouth 3 (three) times daily.    Marland Kitchen letrozole (FEMARA) 2.5 MG tablet 3 tab daily for 5 days per week    . pantoprazole (PROTONIX) 40 MG tablet Take 1 tablet (40 mg total) by mouth daily. 30 tablet 3  . predniSONE (DELTASONE) 50 MG tablet Take 1 tablet (50 mg total) by mouth daily with breakfast. 5 tablet 0  . DULoxetine (CYMBALTA) 60 MG capsule Take 1 capsule (60 mg total) by mouth daily.  OFFICE VISIT NEEDED FOR MORE REFILLS. (Patient not taking: Reported on 04/06/2018) 30 capsule 0   No facility-administered medications prior to visit.     No Known Allergies  ROS As per HPI  PE: Blood pressure 104/73, pulse 86, temperature 98.2 F (36.8 C), temperature source Oral, resp. rate 16, height 5\' 7"  (1.702 m), weight 284 lb (128.8 kg), SpO2 96 %.  Pt examined with Helayne Seminole, CMA, as chaperone.  Gen: Alert, well appearing.  Patient is oriented to person, place, time, and situation. AFFECT: pleasant, lucid thought and speech. CV: RRR, no m/r/g.   LUNGS: CTA bilat, nonlabored resps, good aeration in all lung fields. Chest wall: nontender    LABS:  None today    Chemistry      Component Value Date/Time   NA 137 07/21/2016 0901   K 4.5 07/21/2016  0901   CL 104 07/21/2016 0901   CO2 27 07/21/2016 0901   BUN 10 07/21/2016 0901   CREATININE 0.56 07/21/2016 0901   CREATININE 0.75 10/06/2015 1512      Component Value Date/Time   CALCIUM 9.1 07/21/2016 0901   ALKPHOS 73 07/21/2016 0901   AST 15 07/21/2016 0901   ALT 23 07/21/2016 0901   BILITOT 0.3 07/21/2016 0901      IMPRESSION AND PLAN:  Precordial chest pain: suspect gerd with esophagitis as the cause. Low suspicion of any cardiopulmonary etiology or chest wall pain. Stress/anxiety could be playing a role, but doubt this was the sole cause. Instructions: Pepcid:  Take 1 tab twice a day for 7 days, then take 1 tab twice a day as needed for heartburn/reflux. GERD diet discussed again, no eating late at night, etc.  Spent 30 min with pt today, with >50% of this time spent in counseling and care coordination regarding the above problems.  An After Visit Summary was printed and given to the patient.  FOLLOW UP: Return for as needed.  Signed:  Crissie Sickles, MD           04/06/2018

## 2018-04-12 DIAGNOSIS — Z3169 Encounter for other general counseling and advice on procreation: Secondary | ICD-10-CM | POA: Diagnosis not present

## 2018-04-24 DIAGNOSIS — Z3141 Encounter for fertility testing: Secondary | ICD-10-CM | POA: Diagnosis not present

## 2018-04-27 DIAGNOSIS — N979 Female infertility, unspecified: Secondary | ICD-10-CM | POA: Diagnosis not present

## 2018-08-04 DIAGNOSIS — O039 Complete or unspecified spontaneous abortion without complication: Secondary | ICD-10-CM

## 2018-08-04 HISTORY — DX: Complete or unspecified spontaneous abortion without complication: O03.9

## 2018-08-10 ENCOUNTER — Encounter: Payer: Self-pay | Admitting: Family Medicine

## 2018-08-10 ENCOUNTER — Other Ambulatory Visit: Payer: Self-pay

## 2018-08-10 ENCOUNTER — Ambulatory Visit (INDEPENDENT_AMBULATORY_CARE_PROVIDER_SITE_OTHER): Payer: PRIVATE HEALTH INSURANCE | Admitting: Family Medicine

## 2018-08-10 DIAGNOSIS — Z8742 Personal history of other diseases of the female genital tract: Secondary | ICD-10-CM

## 2018-08-10 DIAGNOSIS — N910 Primary amenorrhea: Secondary | ICD-10-CM

## 2018-08-10 DIAGNOSIS — Z3201 Encounter for pregnancy test, result positive: Secondary | ICD-10-CM | POA: Diagnosis not present

## 2018-08-10 NOTE — Progress Notes (Signed)
Virtual Visit via Video Note  I connected with pt on 08/10/18 at  4:00 PM EDT by a video enabled telemedicine application and verified that I am speaking with the correct person using two identifiers.  Location patient: home Location provider:work or home office Persons participating in the virtual visit: patient, provider  I discussed the limitations of evaluation and management by telemedicine and the availability of in person appointments. The patient expressed understanding and agreed to proceed.  Telemedicine visit is a necessity given the COVID-19 restrictions in place at the current time.  HPI: 38 y/o WF being seen today for a missed menstrual period, states she did a home UPT and it was positive.  LMP was 07/02/18.  No vag bleeding at all since then. Has had some generalized/roving stomach pains/gas/bloating for the last 10-14 days.  The pains are mild/mod. No n/v.  No constipation or diarrhea.   [facility]  She was on femara treatment but has not had this in about 3 months. Her GYN MD is Dr. Benjie Karvonen with [facility].  ROS: no CP, no SOB, no wheezing, no cough, no dizziness, no HAs, no rashes, no melena/hematochezia.  No polyuria or polydipsia.  No myalgias or arthralgias.   Past Medical History:  Diagnosis Date  . Anxiety    Saw a Psychiatrist (Dr. Orland Mustard) but not as of summer 2014.  Marland Kitchen Chronic neck pain   . Chronic tension headaches    (occipital) with medication overuse component  . Depression, major 02/2013  . GERD (gastroesophageal reflux disease)    "silent" for the most part  . Obesity, Class II, BMI 35-39.9   . Palpitations 01/11/2012  . Polycystic ovarian syndrome   . Tobacco dependence     Past Surgical History:  Procedure Laterality Date  . OVARIAN CYST REMOVAL  Age 44 yrs   Large  (Grapefruit size per pt)    Family History  Problem Relation Age of Onset  . Arthritis Mother        "rheumatoid" per pt report  . Diabetes Father    . Cancer Maternal Grandmother        breast    SOCIAL HX:  Social History   Socioeconomic History  . Marital status: Married    Spouse name: Not on file  . Number of children: Not on file  . Years of education: Not on file  . Highest education level: Not on file  Occupational History  . Not on file  Social Needs  . Financial resource strain: Not on file  . Food insecurity:    Worry: Not on file    Inability: Not on file  . Transportation needs:    Medical: Not on file    Non-medical: Not on file  Tobacco Use  . Smoking status: Former Smoker    Packs/day: 0.50    Types: Cigarettes    Last attempt to quit: 08/10/2018  . Smokeless tobacco: Never Used  Substance and Sexual Activity  . Alcohol use: Yes  . Drug use: No  . Sexual activity: Not on file  Lifestyle  . Physical activity:    Days per week: Not on file    Minutes per session: Not on file  . Stress: Not on file  Relationships  . Social connections:    Talks on phone: Not on file    Gets together: Not on file    Attends religious service: Not on file    Active member of club or organization:  Not on file    Attends meetings of clubs or organizations: Not on file    Relationship status: Not on file  Other Topics Concern  . Not on file  Social History Narrative   Divorced.  Remarried 2019, no children.     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   BS/BA from [employer] in [facility] [place].   Tobacco 5 pack-yr history.  Occasional alcohol use.  No drug use.   No exercise.      Current Outpatient Medications:  .  buPROPion (WELLBUTRIN XL) 150 MG 24 hr tablet, Take 150 mg by mouth daily., Disp: , Rfl:  .  busPIRone (BUSPAR) 5 MG tablet, Take 5 mg by mouth 3 (three) times daily. Pt takes BID., Disp: , Rfl:  .  famotidine (PEPCID) 40 MG tablet, 1 tab po bid prn reflux, Disp: 60 tablet, Rfl: 2 .  letrozole (FEMARA) 2.5 MG tablet, 3 tab daily for 5 days per week, Disp: ,  Rfl:  .  pantoprazole (PROTONIX) 40 MG tablet, Take 1 tablet (40 mg total) by mouth daily. (Patient not taking: Reported on 08/10/2018), Disp: 30 tablet, Rfl: 3  EXAM:  VITALS per patient if applicable: LMP 40/11/6759    GENERAL: alert, oriented, appears well and in no acute distress  HEENT: atraumatic, conjunttiva clear, no obvious abnormalities on inspection of external nose and ears  NECK: normal movements of the head and neck  LUNGS: on inspection no signs of respiratory distress, breathing rate appears normal, no obvious gross SOB, gasping or wheezing  CV: no obvious cyanosis  MS: moves all visible extremities without noticeable abnormality  PSYCH/NEURO: pleasant and cooperative, no obvious depression or anxiety, speech and thought processing grossly intact  LABS: none today    Chemistry      Component Value Date/Time   NA 137 07/21/2016 0901   K 4.5 07/21/2016 0901   CL 104 07/21/2016 0901   CO2 27 07/21/2016 0901   BUN 10 07/21/2016 0901   CREATININE 0.56 07/21/2016 0901   CREATININE 0.75 10/06/2015 1512      Component Value Date/Time   CALCIUM 9.1 07/21/2016 0901   ALKPHOS 73 07/21/2016 0901   AST 15 07/21/2016 0901   ALT 23 07/21/2016 0901   BILITOT 0.3 07/21/2016 0901     Lab Results  Component Value Date   HGBA1C 5.8 07/21/2016   Lab Results  Component Value Date   TSH 1.966 01/11/2012    ASSESSMENT AND PLAN:  Discussed the following assessment and plan:  1) Early first trimester pregnancy: based on LMP she is probably about [redacted] wks along. Will have her come in tomorrow for UPT confirmation and will get a baseline HCG quant. Hx of infertility: stopped femara 2-3 mo ago b/c of being busy + covid crisis/restrictions. Will continue to see her current GYN MD for this pregnancy (Dr. Benjie Karvonen). Her buspar, wellbutrin, famotidine, and pantoprazole are all pregnancy category B, so she'll continue these and confirm ongoing med plans with her GYN.  I recommended  she go ahead and add a PNV now.   I discussed the assessment and treatment plan with the patient. The patient was provided an opportunity to ask questions and all were answered. The patient agreed with the plan and demonstrated an understanding of the instructions.   The patient was advised to call back or seek an in-person evaluation if the symptoms worsen or if the condition fails to improve as anticipated.  F/u:  to be determined based on labs.  Suspect she'll just need to return here prn and will be getting OB care via her GYN.  Signed:  Crissie Sickles, MD           08/10/2018

## 2018-08-11 ENCOUNTER — Other Ambulatory Visit (INDEPENDENT_AMBULATORY_CARE_PROVIDER_SITE_OTHER): Payer: Self-pay

## 2018-08-11 DIAGNOSIS — Z3201 Encounter for pregnancy test, result positive: Secondary | ICD-10-CM

## 2018-08-11 DIAGNOSIS — N910 Primary amenorrhea: Secondary | ICD-10-CM

## 2018-08-11 DIAGNOSIS — Z8742 Personal history of other diseases of the female genital tract: Secondary | ICD-10-CM

## 2018-08-11 LAB — HCG, QUANTITATIVE, PREGNANCY: Quantitative HCG: 7429 m[IU]/mL

## 2018-08-11 LAB — POCT URINE PREGNANCY: Preg Test, Ur: POSITIVE — AB

## 2018-08-14 ENCOUNTER — Telehealth: Payer: Self-pay | Admitting: Family Medicine

## 2018-08-14 ENCOUNTER — Ambulatory Visit (INDEPENDENT_AMBULATORY_CARE_PROVIDER_SITE_OTHER): Payer: PRIVATE HEALTH INSURANCE | Admitting: Family Medicine

## 2018-08-14 ENCOUNTER — Other Ambulatory Visit (INDEPENDENT_AMBULATORY_CARE_PROVIDER_SITE_OTHER): Payer: PRIVATE HEALTH INSURANCE

## 2018-08-14 ENCOUNTER — Other Ambulatory Visit: Payer: Self-pay

## 2018-08-14 DIAGNOSIS — O209 Hemorrhage in early pregnancy, unspecified: Secondary | ICD-10-CM

## 2018-08-14 DIAGNOSIS — O208 Other hemorrhage in early pregnancy: Secondary | ICD-10-CM

## 2018-08-14 DIAGNOSIS — F419 Anxiety disorder, unspecified: Secondary | ICD-10-CM | POA: Diagnosis not present

## 2018-08-14 LAB — HCG, QUANTITATIVE, PREGNANCY: Quantitative HCG: 796.69 m[IU]/mL

## 2018-08-14 MED ORDER — LORAZEPAM 0.5 MG PO TABS: ORAL_TABLET | ORAL | 0 refills | Status: DC

## 2018-08-14 NOTE — Telephone Encounter (Signed)
Patient brought in [employer] card. She has O positive blood type.

## 2018-08-14 NOTE — Progress Notes (Signed)
Virtual Visit via Video Note  I connected with pt on 08/14/18 at  8:40 AM EDT by telephone and verified that I am speaking with the correct person using two identifiers.  Location patient: home Location provider:work or home office Persons participating in the virtual visit: patient, provider  I discussed the limitations of evaluation and management by telemedicine/telephone and the availability of in person appointments. The patient expressed understanding and agreed to proceed.  Telemedicine/telephone visit is a necessity given the COVID-19 restrictions in place at the current time.  HPI: 38 y/o WF with whom I am doing a telephone visit today (due to COVID-19 pandemic restrictions) for vag bleeding in early pregnancy. Approx 2-[redacted] wks gestation as of about 3 d/a-->had + UPT and +hcg (quant done). Then about 36h ago she had some pelvic cramping and vag bleeding and she saw a blob of tissue after having some vaginal bleeding.  The cramping and bleeding subsided over the next couple of hours. She currently feels no pelvic pain and sees no vag bleeding.  She is anxious, having pains in chest like she normally has when she gets very anxious.  (See past notes). She is not tearful on the phone.  Denies depression.   Past Medical History:  Diagnosis Date  . Anxiety    Saw a Psychiatrist (Dr. Orland Mustard) but not as of summer 2014.  Marland Kitchen Chronic neck pain   . Chronic tension headaches    (occipital) with medication overuse component  . Depression, major 02/2013  . GERD (gastroesophageal reflux disease)    "silent" for the most part  . Obesity, Class II, BMI 35-39.9   . Palpitations 01/11/2012  . Polycystic ovarian syndrome   . Tobacco dependence     Past Surgical History:  Procedure Laterality Date  . OVARIAN CYST REMOVAL  Age 68 yrs   Large  (Grapefruit size per pt)    Family History  Problem Relation Age of Onset  . Arthritis Mother        "rheumatoid" per pt report  . Diabetes Father    . Cancer Maternal Grandmother        breast     Current Outpatient Medications:  .  buPROPion (WELLBUTRIN XL) 150 MG 24 hr tablet, Take 150 mg by mouth daily., Disp: , Rfl:  .  busPIRone (BUSPAR) 5 MG tablet, Take 5 mg by mouth 3 (three) times daily. Pt takes BID., Disp: , Rfl:  .  famotidine (PEPCID) 40 MG tablet, 1 tab po bid prn reflux, Disp: 60 tablet, Rfl: 2 .  letrozole (FEMARA) 2.5 MG tablet, 3 tab daily for 5 days per week, Disp: , Rfl:  .  pantoprazole (PROTONIX) 40 MG tablet, Take 1 tablet (40 mg total) by mouth daily. (Patient not taking: Reported on 08/10/2018), Disp: 30 tablet, Rfl: 3  EXAM:  VITALS per patient if applicable: none Gen: anxious but not in any distress.  Lucid thought and speech.  ASSESSMENT AND PLAN:  Discussed the following assessment and plan:  Vaginal bleeding affecting early pregnancy - Plan: B-HCG Quant Will follow quant to see if it is doubling q 48h as expected with normal pregnancy. Discussed with pt the possible need for rhogam: pt will bring in a card with her blood type when she comes in for lab today. She is appropriately anxious, esp given her past hx of significant anxiety.  She is having her usual/typical physical symptoms she gets when she gets panicky. I will eRx lorazepam 0.5mg , 1-2 bid prn, #20,  no RF.  I discussed the assessment and treatment plan with the patient. The patient was provided an opportunity to ask questions and all were answered. The patient agreed with the plan and demonstrated an understanding of the instructions.   The patient was advised to call back or seek an in-person evaluation if the symptoms worsen or if the condition fails to improve as anticipated.  Spent 25 min with pt today, with >50% of this time spent in counseling and care coordination regarding the above problems.  F/u: to be determined  Signed:  Crissie Sickles, MD           08/14/2018

## 2018-08-15 ENCOUNTER — Encounter: Payer: Self-pay | Admitting: Family Medicine

## 2018-08-15 NOTE — Telephone Encounter (Signed)
Notified pt of serum HCG level; correlates with pt's sx's->early spont ab. Also, since blood type is + for D antigen, no Rhogam indicated. Signs/symptoms to call or return for were reviewed and pt expressed understanding. Signed:  Crissie Sickles, MD           08/15/2018

## 2018-08-16 ENCOUNTER — Other Ambulatory Visit: Payer: Self-pay

## 2018-10-04 ENCOUNTER — Telehealth: Payer: Self-pay

## 2018-10-04 NOTE — Telephone Encounter (Signed)
She must try over the counter treatment for yeast infection as the first step.

## 2018-10-04 NOTE — Telephone Encounter (Signed)
Received voicemail stating she believes she has vaginal infection due to itching and foul smell. She is requesting Rx.  Please advise, thanks.

## 2018-10-05 ENCOUNTER — Other Ambulatory Visit: Payer: Self-pay | Admitting: Family Medicine

## 2018-10-05 MED ORDER — METRONIDAZOLE 500 MG PO TABS: 500.0000 mg | ORAL_TABLET | Freq: Two times a day (BID) | ORAL | 0 refills | Status: AC

## 2018-10-05 NOTE — Telephone Encounter (Signed)
Remind her that a diagnosis of BV or yeast is traditionally confirmed by looking at the discharge under the microscope. However, I'll send in rx for BV and if d/c does not resolve then she needs to come in for exam and wet prep. Remind her not to drink any alcohol while on metronidazole b/c it will make her have bad n/v.-thx

## 2018-10-05 NOTE — Telephone Encounter (Signed)
Spoke with patient and she stated it was bacterial vaginosis not yeast infection.

## 2018-10-05 NOTE — Telephone Encounter (Signed)
Pt was advised Rx sent and would need in office appt for exam if discharge not resolved.

## 2018-10-06 ENCOUNTER — Telehealth: Payer: Self-pay | Admitting: Family Medicine

## 2018-10-06 NOTE — Telephone Encounter (Signed)
Medication: metFORMIN (GLUCOPHAGE) 500 MG tablet [[id number]]  DISCONTINUED  Patient was discontinued from the metformin due to a pregnancy- However, the patient miscarried. And is requesting the metformin again. Prescribed for PCOS.  Has the patient contacted their pharmacy? Yes  (Agent: If no, request that the patient contact the pharmacy for the refill.) (Agent: If yes, when and what did the pharmacy advise?)  Preferred Pharmacy (with phone number or street name): [facility] [place], [address] [place]. [place] [phone] (Phone) [phone] (Fax    Agent: Please be advised that RX refills may take up to 3 business days. We ask that you follow-up with your pharmacy.

## 2018-10-09 MED ORDER — METFORMIN HCL 500 MG PO TABS: 500.0000 mg | ORAL_TABLET | Freq: Two times a day (BID) | ORAL | 1 refills | Status: DC

## 2018-10-09 NOTE — Telephone Encounter (Signed)
Pt has not taken Metformin since 07/11/17. Last OV was 08/14/18.  Please advise, thanks.

## 2018-10-09 NOTE — Telephone Encounter (Signed)
OK, metformin eRx'd 

## 2018-11-06 ENCOUNTER — Other Ambulatory Visit: Payer: Self-pay

## 2018-11-06 ENCOUNTER — Ambulatory Visit (INDEPENDENT_AMBULATORY_CARE_PROVIDER_SITE_OTHER): Payer: PRIVATE HEALTH INSURANCE | Admitting: Family Medicine

## 2018-11-06 ENCOUNTER — Encounter: Payer: Self-pay | Admitting: Family Medicine

## 2018-11-06 DIAGNOSIS — F411 Generalized anxiety disorder: Secondary | ICD-10-CM

## 2018-11-06 DIAGNOSIS — F4323 Adjustment disorder with mixed anxiety and depressed mood: Secondary | ICD-10-CM

## 2018-11-06 MED ORDER — BUSPIRONE HCL 5 MG PO TABS: 5.0000 mg | ORAL_TABLET | Freq: Three times a day (TID) | ORAL | 1 refills | Status: DC

## 2018-11-06 MED ORDER — LORAZEPAM 0.5 MG PO TABS: ORAL_TABLET | ORAL | 5 refills | Status: DC

## 2018-11-06 MED ORDER — BUPROPION HCL ER (XL) 150 MG PO TB24: 150.0000 mg | ORAL_TABLET | Freq: Every day | ORAL | 3 refills | Status: DC

## 2018-11-06 NOTE — Progress Notes (Signed)
Virtual Visit via Video Note  I connected with pt on 11/06/18 at  4:00 PM EDT by telephone (the video enabled telemedicine application did not work) and verified that I am speaking with the correct person using two identifiers.  Location patient: home Location provider:work or home office Persons participating in the virtual visit: patient, provider  I discussed the limitations of evaluation and management by telephone and the availability of in person appointments. The patient expressed understanding and agreed to proceed.  Telephone visit is a necessity given the COVID-19 restrictions in place at the current time.  HPI: 38 y/o WF with whom I am doing a telephone visit today (due to COVID-19 pandemic restrictions) for f/u anxiety and hx of MDD. She reports being well lately but has run out of wellbutrin x 3d and ran out of her lorazepam recently as well. Taking buspirone 5mg  tid as well. Recent miscarriage 08/2018--->she is gradually processing things appropriately regarding this problem. No significant depressed mood lately.  No panic attacks. Still quite a bit of generalized anxiety that has peaks and troughs.  Use of ativan on a daily basis was helpful when she had the med (took it once a day). Denies alc/drug use.  ROS: no CP, no SOB, no wheezing, no cough, no dizziness, no HAs, no rashes, no melena/hematochezia.  No polyuria or polydipsia.  No myalgias or arthralgias.   Past Medical History:  Diagnosis Date  . Anxiety    Saw a Psychiatrist (Dr. Orland Mustard) but not as of summer 2014.  Post-miscarriage anxiety tx'd with prn ativan 08/2018.  Marland Kitchen Chronic neck pain   . Chronic tension headaches    (occipital) with medication overuse component  . Depression, major 02/2013  . GERD (gastroesophageal reflux disease)    "silent" for the most part  . History of infertility   . Obesity, Class II, BMI 35-39.9   . Palpitations 01/11/2012  . Polycystic ovarian syndrome   . Spontaneous abortion in  first trimester 08/2018  . Tobacco dependence     Past Surgical History:  Procedure Laterality Date  . OVARIAN CYST REMOVAL  Age 11 yrs   Large  (Grapefruit size per pt)    Family History  Problem Relation Age of Onset  . Arthritis Mother        "rheumatoid" per pt report  . Diabetes Father   . Cancer Maternal Grandmother        breast    SOCIAL HX:  Social History   Socioeconomic History  . Marital status: Married    Spouse name: Not on file  . Number of children: Not on file  . Years of education: Not on file  . Highest education level: Not on file  Occupational History  . Not on file  Social Needs  . Financial resource strain: Not on file  . Food insecurity    Worry: Not on file    Inability: Not on file  . Transportation needs    Medical: Not on file    Non-medical: Not on file  Tobacco Use  . Smoking status: Former Smoker    Packs/day: 0.50    Types: Cigarettes    Quit date: 08/10/2018    Years since quitting: 0.2  . Smokeless tobacco: Never Used  Substance and Sexual Activity  . Alcohol use: Yes  . Drug use: No  . Sexual activity: Not on file  Lifestyle  . Physical activity    Days per week: Not on file    Minutes per  session: Not on file  . Stress: Not on file  Relationships  . Social Herbalist on phone: Not on file    Gets together: Not on file    Attends religious service: Not on file    Active member of club or organization: Not on file    Attends meetings of clubs or organizations: Not on file    Relationship status: Not on file  Other Topics Concern  . Not on file  Social History Narrative   Divorced.  Remarried 2019, no children.     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   BS/BA from [employer] in [place].   Tobacco 5 pack-yr history.  Occasional alcohol use.  No drug use.   No exercise.      Current Outpatient Medications:  .  buPROPion (WELLBUTRIN XL) 150 MG 24 hr  tablet, Take 150 mg by mouth daily., Disp: , Rfl:  .  busPIRone (BUSPAR) 5 MG tablet, Take 5 mg by mouth 3 (three) times daily. Pt takes BID., Disp: , Rfl:  .  metFORMIN (GLUCOPHAGE) 500 MG tablet, Take 1 tablet (500 mg total) by mouth 2 (two) times daily with a meal., Disp: 180 tablet, Rfl: 1 .  letrozole (FEMARA) 2.5 MG tablet, 3 tab daily for 5 days per week, Disp: , Rfl:  .  LORazepam (ATIVAN) 0.5 MG tablet, 1-2 tabs po bid prn severe anxiety (Patient not taking: Reported on 11/06/2018), Disp: 20 tablet, Rfl: 0 .  pantoprazole (PROTONIX) 40 MG tablet, Take 1 tablet (40 mg total) by mouth daily. (Patient not taking: Reported on 08/10/2018), Disp: 30 tablet, Rfl: 3  EXAM:  VITALS per patient if applicable:  GENERAL: alert, oriented, appears well and in no acute distress  HEENT: atraumatic, conjunttiva clear, no obvious abnormalities on inspection of external nose and ears  NECK: normal movements of the head and neck  LUNGS: on inspection no signs of respiratory distress, breathing rate appears normal, no obvious gross SOB, gasping or wheezing  CV: no obvious cyanosis  MS: moves all visible extremities without noticeable abnormality  PSYCH/NEURO: pleasant and cooperative, no obvious depression or anxiety, speech and thought processing grossly intact  LABS: none today  Lab Results  Component Value Date   HGBA1C 5.8 07/21/2016     Chemistry      Component Value Date/Time   NA 137 07/21/2016 0901   K 4.5 07/21/2016 0901   CL 104 07/21/2016 0901   CO2 27 07/21/2016 0901   BUN 10 07/21/2016 0901   CREATININE 0.56 07/21/2016 0901   CREATININE 0.75 10/06/2015 1512      Component Value Date/Time   CALCIUM 9.1 07/21/2016 0901   ALKPHOS 73 07/21/2016 0901   AST 15 07/21/2016 0901   ALT 23 07/21/2016 0901   BILITOT 0.3 07/21/2016 0901     Lab Results  Component Value Date   CHOL 177 07/21/2016   HDL 33.30 (L) 07/21/2016   LDLCALC 116 (H) 07/21/2016   TRIG 136.0 07/21/2016    CHOLHDL 5 07/21/2016   Lab Results  Component Value Date   TSH 1.966 01/11/2012    ASSESSMENT AND PLAN:  Discussed the following assessment and plan:  1) GAD, with recent adjustment d/o with mixed anxious and depressed mood. Doing well when on wellbutrin and buspar daily, along with one 0.5mg  ativan qd, sometimes needs it bid. The need for a controlled substance contract was discussed/reviewed with patient today.  Patient will come by the office to read this and sign it. We'll continue wellbutrin XL 150mg  qd, buspar 5mg  tid, and will continue with lorazepam 0.5mg , 1 bid prn (#45 per month, RF x 5).  I discussed the assessment and treatment plan with the patient. The patient was provided an opportunity to ask questions and all were answered. The patient agreed with the plan and demonstrated an understanding of the instructions.   The patient was advised to call back or seek an in-person evaluation if the symptoms worsen or if the condition fails to improve as anticipated.  Spent 15 min with pt today, with >50% of this time spent in counseling and care coordination regarding the above problems.  F/u: 6 mo f/u [facility]/meds  Signed:  Crissie Sickles, MD           11/06/2018

## 2018-11-09 ENCOUNTER — Other Ambulatory Visit: Payer: Self-pay

## 2018-11-09 ENCOUNTER — Other Ambulatory Visit: Payer: Self-pay | Admitting: Family Medicine

## 2018-11-09 NOTE — Telephone Encounter (Signed)
I won't fill this medication b/c it is used to treat a condition that only a specialist can treat (infertility). Unfortunately, the only avenue to get this med is through a GYN MD that treats infertility. Sorry-thx

## 2018-11-09 NOTE — Telephone Encounter (Signed)
Last refill: Last OV: 8.3.20 dx. GAD

## 2018-11-09 NOTE — Telephone Encounter (Signed)
Last refill: 3.4.20  #20, 0  Last OV: 8.3.20 dx. GAD

## 2018-11-09 NOTE — Telephone Encounter (Signed)
Called [facility], all medications have been filled by PCP that they have on file. I called patient and she stated that she has a balance with OB/GYN and until that is paid off, they are refusing to see her, and are unable to refill medication because she is due for a follow up appt. She wanted to know if PCP would fill medication.

## 2018-11-09 NOTE — Telephone Encounter (Signed)
Can you check with her pharmacy and see what provider prescribed this med last? I suspect it was her GYN/Fertility doctor b/c I would not usually be prescribing this med. Let me know (rx request may need to be re-routed to the appropriate provider)--thx!

## 2018-11-09 NOTE — Telephone Encounter (Signed)
Today is the first day of the Pt's cycle and she needs a refill called in today for letrozole (FEMARA) 2.5 MG tablet Sent to   [place], [address] AT [address] Ruthe Mannan [phone] (Phone) [phone] (Fax)   Please advise

## 2018-11-10 NOTE — Telephone Encounter (Signed)
Patient is aware that PCP is unable to refill medication.

## 2018-11-10 NOTE — Telephone Encounter (Signed)
LMOVM for patient to return call regarding medication.

## 2019-02-19 ENCOUNTER — Encounter: Payer: Self-pay | Admitting: Family Medicine

## 2019-02-19 NOTE — Telephone Encounter (Signed)
Are you okay with writing patient a work note? Patient has been sick since Friday night. Cough, fever, chills, fatigue, diarrhea, sneezing and some drainage.

## 2019-02-19 NOTE — Telephone Encounter (Signed)
OK to do work excuse for all of this week, ok to return Monday 02/26/19.

## 2019-02-20 ENCOUNTER — Encounter: Payer: Self-pay | Admitting: Family Medicine

## 2019-05-21 ENCOUNTER — Ambulatory Visit: Payer: PRIVATE HEALTH INSURANCE | Admitting: Family Medicine

## 2019-05-21 NOTE — Progress Notes (Deleted)
OFFICE VISIT  05/21/2019   CC: No chief complaint on file.    HPI:    Patient is a 39 y.o. Caucasian female who presents for ***  Past Medical History:  Diagnosis Date  . Anxiety    Saw a Psychiatrist (Dr. Orland Mustard) but not as of summer 2014.  Post-miscarriage anxiety tx'd with prn ativan 08/2018.  Marland Kitchen Chronic neck pain   . Chronic tension headaches    (occipital) with medication overuse component  . Depression, major 02/2013  . GERD (gastroesophageal reflux disease)    "silent" for the most part  . History of infertility   . Obesity, Class II, BMI 35-39.9   . Palpitations 01/11/2012  . Polycystic ovarian syndrome   . Spontaneous abortion in first trimester 08/2018  . Tobacco dependence     Past Surgical History:  Procedure Laterality Date  . OVARIAN CYST REMOVAL  Age 68 yrs   Large  (Grapefruit size per pt)    Outpatient Medications Prior to Visit  Medication Sig Dispense Refill  . buPROPion (WELLBUTRIN XL) 150 MG 24 hr tablet Take 1 tablet (150 mg total) by mouth daily. 90 tablet 3  . busPIRone (BUSPAR) 5 MG tablet Take 1 tablet (5 mg total) by mouth 3 (three) times daily. Pt takes BID. 270 tablet 1  . letrozole (FEMARA) 2.5 MG tablet 3 tab daily for 5 days per week    . LORazepam (ATIVAN) 0.5 MG tablet 1-2 tabs po bid prn severe anxiety 45 tablet 5  . metFORMIN (GLUCOPHAGE) 500 MG tablet Take 1 tablet (500 mg total) by mouth 2 (two) times daily with a meal. 180 tablet 1  . pantoprazole (PROTONIX) 40 MG tablet Take 1 tablet (40 mg total) by mouth daily. (Patient not taking: Reported on 08/10/2018) 30 tablet 3   No facility-administered medications prior to visit.    No Known Allergies  ROS As per HPI  PE: There were no vitals taken for this visit. ***  LABS:    Chemistry      Component Value Date/Time   NA 137 07/21/2016 0901   K 4.5 07/21/2016 0901   CL 104 07/21/2016 0901   CO2 27 07/21/2016 0901   BUN 10 07/21/2016 0901   CREATININE 0.56 07/21/2016 0901    CREATININE 0.75 10/06/2015 1512      Component Value Date/Time   CALCIUM 9.1 07/21/2016 0901   ALKPHOS 73 07/21/2016 0901   AST 15 07/21/2016 0901   ALT 23 07/21/2016 0901   BILITOT 0.3 07/21/2016 0901     No results found for: WBC, HGB, HCT, MCV, PLT Lab Results  Component Value Date   HGBA1C 5.8 07/21/2016   IMPRESSION AND PLAN:  No problem-specific Assessment & Plan notes found for this encounter.  Meralgia paresthetica  An After Visit Summary was printed and given to the patient.  FOLLOW UP: No follow-ups on file.  Signed:  Crissie Sickles, MD           05/21/2019

## 2019-07-19 ENCOUNTER — Encounter: Payer: Self-pay | Admitting: Family Medicine

## 2019-07-19 ENCOUNTER — Ambulatory Visit: Payer: BC Managed Care – PPO | Admitting: Family Medicine

## 2019-07-19 ENCOUNTER — Other Ambulatory Visit: Payer: Self-pay

## 2019-07-19 VITALS — BP 119/76 | HR 76 | Temp 98.0°F | Resp 16 | Ht 67.0 in | Wt 278.6 lb

## 2019-07-19 DIAGNOSIS — N3281 Overactive bladder: Secondary | ICD-10-CM | POA: Diagnosis not present

## 2019-07-19 DIAGNOSIS — R350 Frequency of micturition: Secondary | ICD-10-CM | POA: Diagnosis not present

## 2019-07-19 DIAGNOSIS — E8881 Metabolic syndrome: Secondary | ICD-10-CM

## 2019-07-19 DIAGNOSIS — E282 Polycystic ovarian syndrome: Secondary | ICD-10-CM

## 2019-07-19 DIAGNOSIS — F410 Panic disorder [episodic paroxysmal anxiety] without agoraphobia: Secondary | ICD-10-CM

## 2019-07-19 DIAGNOSIS — F411 Generalized anxiety disorder: Secondary | ICD-10-CM

## 2019-07-19 DIAGNOSIS — E88819 Insulin resistance, unspecified: Secondary | ICD-10-CM

## 2019-07-19 MED ORDER — BUSPIRONE HCL 5 MG PO TABS: 5.0000 mg | ORAL_TABLET | Freq: Three times a day (TID) | ORAL | 1 refills | Status: AC

## 2019-07-19 MED ORDER — METFORMIN HCL 500 MG PO TABS: 500.0000 mg | ORAL_TABLET | Freq: Two times a day (BID) | ORAL | 0 refills | Status: DC

## 2019-07-19 MED ORDER — BUPROPION HCL ER (XL) 300 MG PO TB24: 300.0000 mg | ORAL_TABLET | Freq: Every day | ORAL | 3 refills | Status: DC

## 2019-07-19 MED ORDER — OXYBUTYNIN CHLORIDE ER 5 MG PO TB24: ORAL_TABLET | ORAL | 1 refills | Status: DC

## 2019-07-19 MED ORDER — LORAZEPAM 0.5 MG PO TABS: ORAL_TABLET | ORAL | 5 refills | Status: DC

## 2019-07-19 NOTE — Progress Notes (Signed)
OFFICE VISIT  07/19/2019   CC:  Chief Complaint  Patient presents with  . Follow-up    anxiety/depression   HPI:    Patient is a 39 y.o. Caucasian female who presents for 8 mo f/u anxiety and history of MDD. A/P as of last visit: "1) GAD, with recent adjustment d/o with mixed anxious and depressed mood. Doing well when on wellbutrin and buspar daily, along with one 0.5mg  ativan qd, sometimes needs it bid. The need for a controlled substance contract was discussed/reviewed with patient today.   Patient will come by the office to read this and sign it. We'll continue wellbutrin XL 150mg  qd, buspar 5mg  tid, and will continue with lorazepam 0.5mg , 1 bid prn (#45 per month, RF x 5)."  Interim hx: Started new job in [place] since 01/2020, company named [employer]. Stress level assoc with work has been much improved but still with significant chronic anxiety. She says she has run out of buspar, lorazepam, and metformin. Has been taking the wellbutrin xl TWICE per day (150mg  dose) last couple of weeks and feels some improvement in overall anxiety level. She does want to restart buspar and lorazepam.  She states she was taking the lorazepam at least 1 tab bid every day when she was on it last summer. She was rx'd metformin by her GYN MD for polycystic ovarian dz, hasn't followed up in a while, is out of this med but wants to restart it. No signif prolonged periods of depressed mood.  PMP AWARE reviewed today: most recent rx for lorazepam 0.5mg  was filled 12/03/18, # 58, rx by me. No red flags.  Notes increase in urinary urgency and frequency gradually over the past months, waxing and waning in severity. No dysuria.  One episode of incontinence when driving and having to hold urine.  Mornings are the worst. Every 10 min for the first hour of work.  Has one cup of coffee each morning. Wakes up at 4 AM to urinate.  No excessive thirst.  Her wt has dropped from last visit about 5-6  lbs.  LMP--started today.  Menses regular.   Past Medical History:  Diagnosis Date  . Anxiety    Saw a Psychiatrist (Dr. Orland Mustard) but not as of summer 2014.  Post-miscarriage anxiety tx'd with prn ativan 08/2018.  Marland Kitchen Chronic neck pain   . Chronic tension headaches    (occipital) with medication overuse component  . Depression, major 02/2013  . GERD (gastroesophageal reflux disease)    "silent" for the most part  . History of infertility   . Obesity, Class II, BMI 35-39.9   . Palpitations 01/11/2012  . Polycystic ovarian syndrome   . Spontaneous abortion in first trimester 08/2018  . Tobacco dependence     Past Surgical History:  Procedure Laterality Date  . OVARIAN CYST REMOVAL  Age 37 yrs   Large  (Grapefruit size per pt)    Outpatient Medications Prior to Visit  Medication Sig Dispense Refill  . buPROPion (WELLBUTRIN XL) 150 MG 24 hr tablet Take 1 tablet (150 mg total) by mouth daily. 90 tablet 3  . busPIRone (BUSPAR) 5 MG tablet Take 1 tablet (5 mg total) by mouth 3 (three) times daily. Pt takes BID. (Patient not taking: Reported on 07/19/2019) 270 tablet 1  . letrozole (FEMARA) 2.5 MG tablet 3 tab daily for 5 days per week    . LORazepam (ATIVAN) 0.5 MG tablet 1-2 tabs po bid prn severe anxiety (Patient not taking: Reported on 07/19/2019)  45 tablet 5  . metFORMIN (GLUCOPHAGE) 500 MG tablet Take 1 tablet (500 mg total) by mouth 2 (two) times daily with a meal. (Patient not taking: Reported on 07/19/2019) 180 tablet 1  . pantoprazole (PROTONIX) 40 MG tablet Take 1 tablet (40 mg total) by mouth daily. (Patient not taking: Reported on 08/10/2018) 30 tablet 3   No facility-administered medications prior to visit.    No Known Allergies  ROS As per HPI  PE: Blood pressure 119/76, pulse 76, temperature 98 F (36.7 C), temperature source Temporal, resp. rate 16, height 5\' 7"  (1.702 m), weight 278 lb 9.6 oz (126.4 kg), SpO2 96 %. Body mass index is 43.63 kg/m.  Gen: Alert, well  appearing.  Patient is oriented to person, place, time, and situation. AFFECT: pleasant, lucid thought and speech. No further exam today.  LABS:    Chemistry      Component Value Date/Time   NA 137 07/21/2016 0901   K 4.5 07/21/2016 0901   CL 104 07/21/2016 0901   CO2 27 07/21/2016 0901   BUN 10 07/21/2016 0901   CREATININE 0.56 07/21/2016 0901   CREATININE 0.75 10/06/2015 1512      Component Value Date/Time   CALCIUM 9.1 07/21/2016 0901   ALKPHOS 73 07/21/2016 0901   AST 15 07/21/2016 0901   ALT 23 07/21/2016 0901   BILITOT 0.3 07/21/2016 0901     Lab Results  Component Value Date   HGBA1C 5.8 07/21/2016   Lab Results  Component Value Date   CHOL 177 07/21/2016   HDL 33.30 (L) 07/21/2016   LDLCALC 116 (H) 07/21/2016   TRIG 136.0 07/21/2016   CHOLHDL 5 07/21/2016   Lab Results  Component Value Date   HGBA1C 5.8 07/21/2016   POC CC UA today: she was unable to give sample for this today.  IMPRESSION AND PLAN:  1) GAD with hx of panic, hx of recurrent MDD: doing pretty good but I agree with her getting back on buspar and lorazepam.  Will continue her on wellbutrin xl and will switch her over to the 300 mg qd dose (see HPI). Lorazepam 0.5mg , 1-2 bid prn, #120, RF x 5.  Restart buspar 5mg  tid. CSC UTD.  2) OAB: (+mild stress urinary incontinence): start trial of oxybutynin. Instructions:  Oxybutynin XL 5mg , 1 qPM, increase to 2 qpm after 7d if not improved.  3) Insulin resistance/PCOS: will check HbA1c and BMET today. I did restart her metformin 500 mg bid but would like her to f/u with her GYN MD for ongoing mgmt of PCOS She's no longer on Femara. Menses are regular.  An After Visit Summary was printed and given to the patient.  FOLLOW UP: Return today (on 07/19/2019) for 2-3 wks f/u OAB.  Signed:  Crissie Sickles, MD           07/19/2019

## 2019-07-20 LAB — BASIC METABOLIC PANEL
BUN: 9 mg/dL (ref 6–23)
CO2: 24 mEq/L (ref 19–32)
Calcium: 8.8 mg/dL (ref 8.4–10.5)
Chloride: 107 mEq/L (ref 96–112)
Creatinine, Ser: 0.67 mg/dL (ref 0.40–1.20)
GFR: 97.89 mL/min (ref >60.00)
Glucose, Bld: 126 mg/dL — ABNORMAL HIGH (ref 70–99)
Potassium: 4.1 mEq/L (ref 3.5–5.1)
Sodium: 140 mEq/L (ref 135–145)

## 2019-07-20 LAB — HEMOGLOBIN A1C: Hgb A1c MFr Bld: 5.9 % (ref 4.6–6.5)

## 2019-08-23 ENCOUNTER — Encounter: Payer: Self-pay | Admitting: Family Medicine

## 2019-08-23 NOTE — Telephone Encounter (Signed)
Please advise, thanks.

## 2019-08-24 ENCOUNTER — Ambulatory Visit: Payer: BC Managed Care – PPO | Admitting: Family Medicine

## 2019-09-10 DIAGNOSIS — N911 Secondary amenorrhea: Secondary | ICD-10-CM | POA: Diagnosis not present

## 2019-09-10 DIAGNOSIS — Z3201 Encounter for pregnancy test, result positive: Secondary | ICD-10-CM | POA: Diagnosis not present

## 2019-09-18 DIAGNOSIS — O99211 Obesity complicating pregnancy, first trimester: Secondary | ICD-10-CM | POA: Diagnosis not present

## 2019-09-18 DIAGNOSIS — O09511 Supervision of elderly primigravida, first trimester: Secondary | ICD-10-CM | POA: Diagnosis not present

## 2019-09-18 DIAGNOSIS — Z3A08 8 weeks gestation of pregnancy: Secondary | ICD-10-CM | POA: Diagnosis not present

## 2019-09-18 DIAGNOSIS — O200 Threatened abortion: Secondary | ICD-10-CM | POA: Diagnosis not present

## 2019-10-01 DIAGNOSIS — O021 Missed abortion: Secondary | ICD-10-CM | POA: Diagnosis not present

## 2019-10-11 DIAGNOSIS — O021 Missed abortion: Secondary | ICD-10-CM | POA: Diagnosis not present

## 2019-11-19 ENCOUNTER — Telehealth: Payer: Self-pay

## 2019-11-19 NOTE — Telephone Encounter (Signed)
No mention of a form completion in patient's chart prior to this message.  No CPE done in the last year.  Unsure if we will be able to complete any information on a form for employer without a CPE and DR McGowen is out of the office until next Tuesday 11/27/19.  LMOM for pt to CB.

## 2019-11-19 NOTE — Telephone Encounter (Signed)
Patient requests form completion for new employer. She also needs a current TB skin test. The deadline is Monday. Her insurance runs out tomorrow. Please call.

## 2019-11-21 NOTE — Telephone Encounter (Signed)
Rude. Won't tolerate it. Dismiss her please. Thank you.

## 2019-11-21 NOTE — Telephone Encounter (Signed)
Sent as FYI. 

## 2019-11-21 NOTE — Telephone Encounter (Signed)
Offered patient first available appointment for CPE on 12/07/19. Patient said that wasn't soon enough and hung up.

## 2019-11-22 ENCOUNTER — Encounter: Payer: Self-pay | Admitting: Family Medicine

## 2019-11-22 NOTE — Telephone Encounter (Signed)
Letter placed in folder for signature.

## 2019-12-28 ENCOUNTER — Other Ambulatory Visit: Payer: Self-pay | Admitting: Family Medicine

## 2020-03-05 DIAGNOSIS — Z3201 Encounter for pregnancy test, result positive: Secondary | ICD-10-CM | POA: Diagnosis not present

## 2020-03-06 DIAGNOSIS — O3680X0 Pregnancy with inconclusive fetal viability, not applicable or unspecified: Secondary | ICD-10-CM | POA: Diagnosis not present

## 2020-03-06 DIAGNOSIS — Z3A01 Less than 8 weeks gestation of pregnancy: Secondary | ICD-10-CM | POA: Diagnosis not present

## 2020-03-06 DIAGNOSIS — O09291 Supervision of pregnancy with other poor reproductive or obstetric history, first trimester: Secondary | ICD-10-CM | POA: Diagnosis not present

## 2020-03-13 DIAGNOSIS — N911 Secondary amenorrhea: Secondary | ICD-10-CM | POA: Diagnosis not present

## 2020-03-13 DIAGNOSIS — Z3201 Encounter for pregnancy test, result positive: Secondary | ICD-10-CM | POA: Diagnosis not present

## 2020-03-19 DIAGNOSIS — J101 Influenza due to other identified influenza virus with other respiratory manifestations: Secondary | ICD-10-CM | POA: Diagnosis not present

## 2020-03-19 DIAGNOSIS — M791 Myalgia, unspecified site: Secondary | ICD-10-CM | POA: Diagnosis not present

## 2020-03-19 DIAGNOSIS — Z20822 Contact with and (suspected) exposure to covid-19: Secondary | ICD-10-CM | POA: Diagnosis not present

## 2020-04-05 DIAGNOSIS — Z8616 Personal history of COVID-19: Secondary | ICD-10-CM

## 2020-04-05 HISTORY — DX: Personal history of COVID-19: Z86.16

## 2020-04-14 ENCOUNTER — Inpatient Hospital Stay (HOSPITAL_COMMUNITY)
Admission: AD | Admit: 2020-04-14 | Discharge: 2020-04-14 | Disposition: A | Discharge status: Home / Self Care | Payer: BC Managed Care – PPO | Attending: Obstetrics and Gynecology | Admitting: Obstetrics and Gynecology

## 2020-04-14 ENCOUNTER — Inpatient Hospital Stay (HOSPITAL_COMMUNITY): Payer: BC Managed Care – PPO

## 2020-04-14 ENCOUNTER — Encounter (HOSPITAL_COMMUNITY): Payer: Self-pay | Admitting: Obstetrics and Gynecology

## 2020-04-14 ENCOUNTER — Other Ambulatory Visit: Payer: Self-pay

## 2020-04-14 DIAGNOSIS — O26891 Other specified pregnancy related conditions, first trimester: Secondary | ICD-10-CM | POA: Diagnosis not present

## 2020-04-14 DIAGNOSIS — O034 Incomplete spontaneous abortion without complication: Secondary | ICD-10-CM | POA: Diagnosis not present

## 2020-04-14 DIAGNOSIS — O039 Complete or unspecified spontaneous abortion without complication: Secondary | ICD-10-CM

## 2020-04-14 DIAGNOSIS — Z3A10 10 weeks gestation of pregnancy: Secondary | ICD-10-CM | POA: Diagnosis not present

## 2020-04-14 DIAGNOSIS — Z87891 Personal history of nicotine dependence: Secondary | ICD-10-CM | POA: Diagnosis not present

## 2020-04-14 DIAGNOSIS — O021 Missed abortion: Secondary | ICD-10-CM | POA: Diagnosis not present

## 2020-04-14 DIAGNOSIS — Z3A00 Weeks of gestation of pregnancy not specified: Secondary | ICD-10-CM | POA: Diagnosis not present

## 2020-04-14 DIAGNOSIS — D259 Leiomyoma of uterus, unspecified: Secondary | ICD-10-CM | POA: Diagnosis not present

## 2020-04-14 DIAGNOSIS — N96 Recurrent pregnancy loss: Secondary | ICD-10-CM | POA: Diagnosis not present

## 2020-04-14 LAB — CBC
HCT: 39.7 % (ref 36.0–46.0)
Hemoglobin: 13.5 g/dL (ref 12.0–15.0)
MCH: 30.5 pg (ref 26.0–34.0)
MCHC: 34 g/dL (ref 30.0–36.0)
MCV: 89.6 fL (ref 80.0–100.0)
Platelets: 478 10*3/uL — ABNORMAL HIGH (ref 150–400)
RBC: 4.43 MIL/uL (ref 3.87–5.11)
RDW: 13.2 % (ref 11.5–15.5)
WBC: 15.5 10*3/uL — ABNORMAL HIGH (ref 4.0–10.5)
nRBC: 0 % (ref 0.0–0.2)

## 2020-04-14 LAB — COMPREHENSIVE METABOLIC PANEL
ALT: 21 U/L (ref 0–44)
AST: 16 U/L (ref 15–41)
Albumin: 3.5 g/dL (ref 3.5–5.0)
Alkaline Phosphatase: 59 U/L (ref 38–126)
Anion gap: 12 (ref 5–15)
BUN: 7 mg/dL (ref 6–20)
CO2: 22 mmol/L (ref 22–32)
Calcium: 8.8 mg/dL — ABNORMAL LOW (ref 8.9–10.3)
Chloride: 101 mmol/L (ref 98–111)
Creatinine, Ser: 0.63 mg/dL (ref 0.44–1.00)
GFR, Estimated: >60 mL/min (ref >60)
Glucose, Bld: 102 mg/dL — ABNORMAL HIGH (ref 70–99)
Potassium: 4 mmol/L (ref 3.5–5.1)
Sodium: 135 mmol/L (ref 135–145)
Total Bilirubin: 0.6 mg/dL (ref 0.3–1.2)
Total Protein: 6.8 g/dL (ref 6.5–8.1)

## 2020-04-14 LAB — TYPE AND SCREEN
ABO/RH(D): O POS
Antibody Screen: NEGATIVE

## 2020-04-14 LAB — HCG, QUANTITATIVE, PREGNANCY: hCG, Beta Chain, Quant, S: 4884 m[IU]/mL — ABNORMAL HIGH (ref <5)

## 2020-04-14 MED ORDER — HYDROCODONE-ACETAMINOPHEN 5-325 MG PO TABS
2.0000 | ORAL_TABLET | Freq: Once | ORAL | Status: AC
  Administered 2020-04-14: 2 via ORAL
  Filled 2020-04-14: qty 2

## 2020-04-14 MED ORDER — MISOPROSTOL 200 MCG PO TABS: 800.0000 ug | ORAL_TABLET | Freq: Once | ORAL | Status: DC

## 2020-04-14 MED ORDER — HYDROCODONE-ACETAMINOPHEN 5-325 MG PO TABS: 2.0000 | ORAL_TABLET | ORAL | 0 refills | Status: AC | PRN

## 2020-04-14 MED ORDER — PROMETHAZINE HCL 12.5 MG PO TABS: 12.5000 mg | ORAL_TABLET | Freq: Four times a day (QID) | ORAL | 0 refills | Status: DC | PRN

## 2020-04-14 MED ORDER — LACTATED RINGERS IV SOLN: Freq: Once | INTRAVENOUS | Status: AC

## 2020-04-14 MED ORDER — HYDROMORPHONE HCL 1 MG/ML IJ SOLN
1.0000 mg | Freq: Once | INTRAMUSCULAR | Status: AC
Start: 2020-04-14 — End: 2020-04-14
  Administered 2020-04-14: 1 mg via INTRAVENOUS
  Filled 2020-04-14: qty 1

## 2020-04-14 MED ORDER — ONDANSETRON HCL 4 MG/2ML IJ SOLN
4.0000 mg | Freq: Once | INTRAMUSCULAR | Status: AC
  Administered 2020-04-14: 4 mg via INTRAVENOUS
  Filled 2020-04-14: qty 2

## 2020-04-14 MED ORDER — MISOPROSTOL 200 MCG PO TABS
800.0000 ug | ORAL_TABLET | Freq: Once | ORAL | Status: AC
  Administered 2020-04-14: 800 ug via RECTAL
  Filled 2020-04-14: qty 4

## 2020-04-14 MED ORDER — IBUPROFEN 600 MG PO TABS: 600.0000 mg | ORAL_TABLET | Freq: Four times a day (QID) | ORAL | 0 refills | Status: DC | PRN

## 2020-04-14 NOTE — Discharge Instructions (Signed)
Miscarriage A miscarriage is the loss of pregnancy before the 20th week. Most miscarriages happen during the first 3 months of pregnancy. Sometimes, a miscarriage can happen before a woman knows that she is pregnant. Having a miscarriage can be an emotional experience. If you have had a miscarriage, talk with your health care provider about any questions you may have about the loss of your baby, the grieving process, and your plans for future pregnancy. What are the causes? Many times, the cause of a miscarriage is not known. What increases the risk? The following factors may make a pregnant woman more likely to have a miscarriage: Certain medical conditions  Conditions that affect the hormone balance in the body, such as thyroid disease or polycystic ovary syndrome.  Diabetes.  Autoimmune disorders.  Infections.  Bleeding disorders.  Obesity. Lifestyle factors  Using products with tobacco or nicotine in them or being exposed to tobacco smoke.  Having alcohol.  Having large amounts of caffeine.  Recreational drug use. Problems with reproductive organs or structures  Cervical insufficiency. This is when the lowest part of the uterus (cervix) opens and thins before pregnancy is at term.  Having a condition called Asherman syndrome. This syndrome causes scarring in the uterus or causes the uterus to be abnormal in structure.  Fibrous growths, called fibroids, in the uterus.  Congenital abnormalities. These problems are present at birth.  Infection of the cervix or uterus. Personal or medical history  Injury (trauma).  Having had a miscarriage before.  Being younger than age 18 or older than age 35.  Exposure to harmful substances in the environment. This may include radiation or heavy metals, such as lead.  Use of certain medicines. What are the signs or symptoms? Symptoms of this condition include:  Vaginal bleeding or spotting, with or without cramps or  pain.  Pain or cramping in the abdomen or lower back.  Fluid or tissue coming out of the vagina. How is this diagnosed? This condition may be diagnosed based on:  A physical exam.  Ultrasound.  Lab tests, such as blood tests, urine tests, or swabs for infection. How is this treated? Treatment for a miscarriage is sometimes not needed if all the pregnancy tissue that was in the uterus comes out on its own, and there are no other problems such as infection or heavy bleeding. In other cases, this condition may be treated with:  Dilation and curettage (D&C). In this procedure, the cervix is stretched open and any remaining pregnancy tissue is removed from the lining of the uterus (endometrium).  Medicines. These may include: ? Antibiotic medicine, to treat infection. ? Medicine to help any remaining pregnancy tissue come out of the body. ? Medicine to reduce (contract) the size of the uterus. These medicines may be given if there is a lot of bleeding. If you have Rh-negative blood, you may be given an injection of a medicine called Rho(D) immune globulin. This medicine helps prevent problems with future pregnancies. Follow these instructions at home: Medicines  Take over-the-counter and prescription medicines only as told by your health care provider.  If you were prescribed antibiotic medicine, take it as told by your health care provider. Do not stop taking the antibiotic even if you start to feel better. Activity  Rest as told by your health care provider. Ask your health care provider what activities are safe for you.  Have someone help with home and family responsibilities during this time. General instructions  Monitor how much tissue   or blood clot material comes out of the vagina.  Do not have sex, douche, or put anything, such as tampons, in your vagina until your health care provider says it is okay.  To help you and your partner with the grieving process, talk with your  health care provider or get counseling.  When you are ready, meet with your health care provider to discuss any important steps you should take for your health. Also, discuss steps you should take to have a healthy pregnancy in the future.  Keep all follow-up visits. This is important.   Where to find more information  The American College of Obstetricians and Gynecologists: acog.org  [place] Department of Health and Human Services Office of Women's Health: hrsa.gov/office-womens-health Contact a health care provider if:  You have a fever or chills.  There is bad-smelling fluid coming from the vagina.  You have more bleeding instead of less.  Tissue or blood clots come out of your vagina. Get help right away if:  You have severe cramps or pain in your back or abdomen.  Heavy bleeding soaks through 2 large sanitary pads an hour for more than 2 hours.  You become light-headed or weak.  You faint.  You feel sad, and your sadness takes over your thoughts.  You think about hurting yourself. If you ever feel like you may hurt yourself or others, or have thoughts about taking your own life, get help right away. Go to your nearest emergency department or:  Call your local emergency services (911 in the [place]  Call a suicide crisis helpline, such as the National Suicide Prevention Lifeline at 1-800-273-8255. This is open 24 hours a day in the [place]  Text the Crisis Text Line at 741741 (in the [place] Summary  Most miscarriages happen in the first 3 months of pregnancy. Sometimes miscarriage happens before a woman knows that she is pregnant.  Follow instructions from your health care provider about medicines and activity.  To help you and your partner with grieving, talk with your health care provider or get counseling.  Keep all follow-up visits. This information is not intended to replace advice given to you by your health care provider. Make sure you discuss any questions you  have with your health care provider. Document Revised: 09/21/2019 Document Reviewed: 09/21/2019 Elsevier Patient Education  2021 Elsevier Inc.  

## 2020-04-14 NOTE — MAU Provider Note (Signed)
History     CSN: [id number]  Arrival date and time: 04/14/20 1038   None     Chief Complaint  Patient presents with  . Abdominal Pain  . Vaginal Bleeding   HPI Carrie Blanchard is a 40 y.o. G3P0 who presents to MAU from office for evaluation of active miscarriage in progress. Patient reports new onset vaginal bleeding last night. She presented to office this morning and while there passed several large clots. On arrival to MAU she endorses lower abdominal and low back pain which she rates 5/10. She is not sure if she is continuing to bleed but denies dizziness, SOB, weakness, syncope.   [facility]   OB History    Gravida  3   Para      Term      Preterm      AB  2   Living        SAB  2   IAB      Ectopic      Multiple      Live Births              Past Medical History:  Diagnosis Date  . Anxiety    Saw a Psychiatrist (Dr. Kateri PlummerMorrow) but not as of summer 2014.  Post-miscarriage anxiety tx'd with prn ativan 08/2018.  Marland Kitchen. Chronic neck pain   . Chronic tension headaches    (occipital) with medication overuse component  . Depression, major 02/2013  . GERD (gastroesophageal reflux disease)    "silent" for the most part  . History of infertility   . Obesity, Class II, BMI 35-39.9   . Palpitations 01/11/2012  . Polycystic ovarian syndrome   . Spontaneous abortion in first trimester 08/2018  . Tobacco dependence     Past Surgical History:  Procedure Laterality Date  . OVARIAN CYST REMOVAL  Age 75 yrs   Large  (Grapefruit size per pt)    Family History  Problem Relation Age of Onset  . Arthritis Mother        "rheumatoid" per pt report  . Diabetes Father   . Cancer Maternal Grandmother        breast    Social History   Tobacco Use  . Smoking status: Former Smoker    Packs/day: 0.50    Types: Cigarettes    Quit date: 08/10/2018    Years since quitting: 1.6  . Smokeless tobacco: Never Used  Vaping Use  . Vaping Use:  Never used  Substance Use Topics  . Alcohol use: Yes  . Drug use: No    Allergies: No Known Allergies  Medications Prior to Admission  Medication Sig Dispense Refill Last Dose  . buPROPion (WELLBUTRIN XL) 300 MG 24 hr tablet Take 1 tablet (300 mg total) by mouth daily. 90 tablet 3   . busPIRone (BUSPAR) 5 MG tablet Take 1 tablet (5 mg total) by mouth 3 (three) times daily. Pt takes BID. 270 tablet 1   . LORazepam (ATIVAN) 0.5 MG tablet 1-2 tabs po bid prn severe anxiety 120 tablet 5   . metFORMIN (GLUCOPHAGE) 500 MG tablet Take 1 tablet (500 mg total) by mouth 2 (two) times daily with a meal. 180 tablet 0   . oxybutynin (DITROPAN XL) 5 MG 24 hr tablet 1 tab po with supper every evening 30 tablet 1     Review of Systems  Gastrointestinal: Positive for abdominal pain.  Genitourinary: Positive for vaginal bleeding.  Musculoskeletal: Positive  for back pain.  All other systems reviewed and are negative.  Physical Exam   Blood pressure 133/71, pulse 83, temperature 98.8 F (37.1 C), temperature source Oral, resp. rate 16, height 5\' 7"  (1.702 m), weight 120.5 kg, last menstrual period 01/29/2020, SpO2 97 %.  Physical Exam Vitals and nursing note reviewed. Exam conducted with a chaperone present.  Constitutional:      General: She is in acute distress.     Appearance: She is well-developed. She is not ill-appearing.  Cardiovascular:     Rate and Rhythm: Normal rate.     Heart sounds: Normal heart sounds.  Pulmonary:     Effort: Pulmonary effort is normal.     Breath sounds: Normal breath sounds.  Abdominal:     Palpations: Abdomen is soft.     Tenderness: There is no abdominal tenderness.  Genitourinary:    Comments: Large dark red clot dislodged with insertion of speculum. No additional bleeding after removal of clot.  Skin:    General: Skin is warm and dry.     Capillary Refill: Capillary refill takes less than 2 seconds.  Neurological:     Mental Status: She is alert and  oriented to person, place, and time.     MAU Course  Procedures: speculum exam, OB Ultrasound  --Report received from Dr. Marvel Plan prior to patient arrival. EBL 300-456mL, s/p speculum exam x 2, Dr. Marvel Plan able to remove GS but unable to perform full stat workup as indicated by patient acuity.   --Initial presentation discussed with Dr. Nehemiah Settle. 800 mcg Cytotec advised based on description of 300-400 mL bleeding in office prior to arrival and in setting of timeline for ultrasound results. Patient agreeable. Order placed  --VSS, bleeding negligible throughout evaluation in MAU  Patient Vitals for the past 24 hrs:  BP Temp Temp src Pulse Resp SpO2 Height Weight  04/14/20 1411 (!) 118/56 97.7 F (36.5 C) -- 68 17 99 % -- --  04/14/20 1052 133/71 98.8 F (37.1 C) Oral 83 16 97 % -- --  04/14/20 1047 -- -- -- -- -- -- 5\' 7"  (1.702 m) 120.5 kg   Results for orders placed or performed during the hospital encounter of 04/14/20 (from the past 24 hour(s))  CBC     Status: Abnormal   Collection Time: 04/14/20 10:55 AM  Result Value Ref Range   WBC 15.5 (H) 4.0 - 10.5 K/uL   RBC 4.43 3.87 - 5.11 MIL/uL   Hemoglobin 13.5 12.0 - 15.0 g/dL   HCT 39.7 36.0 - 46.0 %   MCV 89.6 80.0 - 100.0 fL   MCH 30.5 26.0 - 34.0 pg   MCHC 34.0 30.0 - 36.0 g/dL   RDW 13.2 11.5 - 15.5 %   Platelets 478 (H) 150 - 400 K/uL   nRBC 0.0 0.0 - 0.2 %  Comprehensive metabolic panel     Status: Abnormal   Collection Time: 04/14/20 10:55 AM  Result Value Ref Range   Sodium 135 135 - 145 mmol/L   Potassium 4.0 3.5 - 5.1 mmol/L   Chloride 101 98 - 111 mmol/L   CO2 22 22 - 32 mmol/L   Glucose, Bld 102 (H) 70 - 99 mg/dL   BUN 7 6 - 20 mg/dL   Creatinine, Ser 0.63 0.44 - 1.00 mg/dL   Calcium 8.8 (L) 8.9 - 10.3 mg/dL   Total Protein 6.8 6.5 - 8.1 g/dL   Albumin 3.5 3.5 - 5.0 g/dL   AST 16 15 -  41 U/L   ALT 21 0 - 44 U/L   Alkaline Phosphatase 59 38 - 126 U/L   Total Bilirubin 0.6 0.3 - 1.2 mg/dL   GFR,  Estimated >60 >60 mL/min   Anion gap 12 5 - 15  hCG, quantitative, pregnancy     Status: Abnormal   Collection Time: 04/14/20 10:55 AM  Result Value Ref Range   hCG, Beta Chain, Quant, S 4,884 (H) <5 mIU/mL  Type and screen [place]     Status: None   Collection Time: 04/14/20 11:47 AM  Result Value Ref Range   ABO/RH(D) O POS    Antibody Screen NEG    Sample Expiration      04/17/2020,2359 Performed at [facility], [place] [address], [place], [place] [address]    [place] Transvaginal  Result Date: 04/14/2020 CLINICAL DATA:  Miscarriage in progress. EXAM: TRANSVAGINAL OB ULTRASOUND TECHNIQUE: Transvaginal ultrasound was performed for complete evaluation of the gestation as well as the maternal uterus, adnexal regions, and pelvic cul-de-sac. COMPARISON:  None. FINDINGS: Intrauterine gestational sac: None Yolk sac:  Not Visualized. Embryo:  Not Visualized. Cardiac Activity: Not Visualized. Subchorionic hemorrhage:  None visualized. Maternal uterus/adnexae: Ovaries are not visualized. Large fibroid measuring 9.6 x 9.0 x 8.7 cm is noted. No free fluid is noted. IMPRESSION: No intrauterine gestational sac, yolk sac, fetal pole, or cardiac activity visualized. Differential considerations include intrauterine gestation too early to be sonographically visualized, spontaneous abortion, or ectopic pregnancy. Consider follow-up ultrasound in 14 days and serial quantitative beta HCG follow-up. Electronically Signed   By: Marijo Conception M.D.   On: 04/14/2020 13:08    Early Intrauterine Pregnancy Failure  X Confirmed pregnancy loss  X Blood type O POS  X  Medication dispensed  --> Cytotec 800 mcg    --> Ibuprofen 600 mg 1 tablet by mouth every 6 hours as needed #30  --> Hydrocodone/acetaminophen 5/325 mg by mouth every 4 to 6 hours as needed  -->  Phenergan 12.5 mg by mouth every 4 hours as needed for nausea  Meds ordered this encounter  Medications  . lactated ringers  infusion  . HYDROmorphone (DILAUDID) injection 1 mg  . misoprostol (CYTOTEC) tablet 800 mcg    Miscarriage, passed gestational sac in office prior to arrival  . ondansetron [facility]) injection 4 mg  . HYDROcodone-acetaminophen (NORCO/VICODIN) 5-325 MG per tablet 2 tablet  . ibuprofen (ADVIL) 600 MG tablet    Sig: Take 1 tablet (600 mg total) by mouth every 6 (six) hours as needed.    Dispense:  30 tablet    Refill:  0    Order Specific Question:   Supervising Provider    Answer:   Truett Mainland [[id number]]  . HYDROcodone-acetaminophen (NORCO/VICODIN) 5-325 MG tablet    Sig: Take 2 tablets by mouth every 4 (four) hours as needed for up to 3 days for severe pain.    Dispense:  36 tablet    Refill:  0    Order Specific Question:   Supervising Provider    Answer:   Truett Mainland [[id number]]  . promethazine (PHENERGAN) 12.5 MG tablet    Sig: Take 1 tablet (12.5 mg total) by mouth every 6 (six) hours as needed for nausea or vomiting.    Dispense:  30 tablet    Refill:  0    Order Specific Question:   Supervising Provider    Answer:   Truett Mainland [[id number]]   Assessment  and Plan  --40 y.o. G3P0020 s/p complete miscarriage --VSS, pain well managed with treatments administered in MAU --S/p Cytotec in MAU --Evaluation and plan for discharge home discussed with Dr. Wilhelmenia Blase prior to discharge --Discharge home in stable condition  F/U: --Per Dr. Wilhelmenia Blase, patient to have repeat quant hCG in one week, Provider visit in two weeks  Darlina Rumpf, CNM 04/14/2020, 3:55 PM

## 2020-04-14 NOTE — MAU Note (Signed)
Carrie Blanchard is a 41 y.o. at [redacted]w[redacted]d here in MAU reporting: was at [facility] and Dr Marvel Plan removed sac but she is still having bleeding. Pt reports there might be some tissue Dr Marvel Plan was not able to remove. Having lower abdominal and back pain.  Onset of complaint: today  Pain score: abdominal pain 5/10, back pain 5/10  Vitals:   04/14/20 1052  BP: 133/71  Pulse: 83  Resp: 16  Temp: 98.8 F (37.1 C)  SpO2: 97%     Lab orders placed from triage: none

## 2020-05-09 DIAGNOSIS — O021 Missed abortion: Secondary | ICD-10-CM | POA: Diagnosis not present

## 2020-05-28 DIAGNOSIS — N915 Oligomenorrhea, unspecified: Secondary | ICD-10-CM | POA: Diagnosis not present

## 2020-05-28 DIAGNOSIS — N96 Recurrent pregnancy loss: Secondary | ICD-10-CM | POA: Diagnosis not present

## 2020-05-28 DIAGNOSIS — E282 Polycystic ovarian syndrome: Secondary | ICD-10-CM | POA: Diagnosis not present

## 2020-05-28 DIAGNOSIS — N926 Irregular menstruation, unspecified: Secondary | ICD-10-CM | POA: Diagnosis not present

## 2020-06-26 DIAGNOSIS — N96 Recurrent pregnancy loss: Secondary | ICD-10-CM | POA: Diagnosis not present

## 2020-07-31 DIAGNOSIS — U071 COVID-19: Secondary | ICD-10-CM | POA: Diagnosis not present

## 2020-07-31 DIAGNOSIS — R197 Diarrhea, unspecified: Secondary | ICD-10-CM | POA: Diagnosis not present

## 2020-07-31 DIAGNOSIS — Z20822 Contact with and (suspected) exposure to covid-19: Secondary | ICD-10-CM | POA: Diagnosis not present

## 2020-07-31 DIAGNOSIS — M791 Myalgia, unspecified site: Secondary | ICD-10-CM | POA: Diagnosis not present

## 2020-08-25 DIAGNOSIS — J0100 Acute maxillary sinusitis, unspecified: Secondary | ICD-10-CM | POA: Diagnosis not present

## 2020-08-25 DIAGNOSIS — R0981 Nasal congestion: Secondary | ICD-10-CM | POA: Diagnosis not present

## 2020-08-25 DIAGNOSIS — R059 Cough, unspecified: Secondary | ICD-10-CM | POA: Diagnosis not present

## 2020-09-20 DIAGNOSIS — Z720 Tobacco use: Secondary | ICD-10-CM | POA: Diagnosis not present

## 2020-10-30 DIAGNOSIS — O99211 Obesity complicating pregnancy, first trimester: Secondary | ICD-10-CM | POA: Diagnosis not present

## 2020-10-30 DIAGNOSIS — N911 Secondary amenorrhea: Secondary | ICD-10-CM | POA: Diagnosis not present

## 2020-10-30 DIAGNOSIS — O200 Threatened abortion: Secondary | ICD-10-CM | POA: Diagnosis not present

## 2020-10-30 DIAGNOSIS — N96 Recurrent pregnancy loss: Secondary | ICD-10-CM | POA: Diagnosis not present

## 2020-10-30 DIAGNOSIS — O3411 Maternal care for benign tumor of corpus uteri, first trimester: Secondary | ICD-10-CM | POA: Diagnosis not present

## 2020-10-30 DIAGNOSIS — Z3A10 10 weeks gestation of pregnancy: Secondary | ICD-10-CM | POA: Diagnosis not present

## 2020-10-30 DIAGNOSIS — O09519 Supervision of elderly primigravida, unspecified trimester: Secondary | ICD-10-CM | POA: Diagnosis not present

## 2020-11-04 DIAGNOSIS — Z720 Tobacco use: Secondary | ICD-10-CM | POA: Diagnosis not present

## 2020-12-03 DIAGNOSIS — D23122 Other benign neoplasm of skin of left lower eyelid, including canthus: Secondary | ICD-10-CM | POA: Diagnosis not present

## 2020-12-03 DIAGNOSIS — D23111 Other benign neoplasm of skin of right upper eyelid, including canthus: Secondary | ICD-10-CM | POA: Diagnosis not present

## 2020-12-16 DIAGNOSIS — N96 Recurrent pregnancy loss: Secondary | ICD-10-CM | POA: Diagnosis not present

## 2020-12-16 DIAGNOSIS — Z3141 Encounter for fertility testing: Secondary | ICD-10-CM | POA: Diagnosis not present

## 2020-12-16 DIAGNOSIS — N979 Female infertility, unspecified: Secondary | ICD-10-CM | POA: Diagnosis not present

## 2021-01-08 DIAGNOSIS — Z3141 Encounter for fertility testing: Secondary | ICD-10-CM | POA: Diagnosis not present

## 2021-01-08 DIAGNOSIS — N96 Recurrent pregnancy loss: Secondary | ICD-10-CM | POA: Diagnosis not present

## 2021-01-08 DIAGNOSIS — Z113 Encounter for screening for infections with a predominantly sexual mode of transmission: Secondary | ICD-10-CM | POA: Diagnosis not present

## 2021-01-26 DIAGNOSIS — M791 Myalgia, unspecified site: Secondary | ICD-10-CM | POA: Diagnosis not present

## 2021-01-26 DIAGNOSIS — J00 Acute nasopharyngitis [common cold]: Secondary | ICD-10-CM | POA: Diagnosis not present

## 2021-01-26 DIAGNOSIS — Z03818 Encounter for observation for suspected exposure to other biological agents ruled out: Secondary | ICD-10-CM | POA: Diagnosis not present

## 2021-01-26 DIAGNOSIS — Z20822 Contact with and (suspected) exposure to covid-19: Secondary | ICD-10-CM | POA: Diagnosis not present

## 2021-03-26 DIAGNOSIS — N96 Recurrent pregnancy loss: Secondary | ICD-10-CM | POA: Diagnosis not present

## 2021-03-26 DIAGNOSIS — Z3141 Encounter for fertility testing: Secondary | ICD-10-CM | POA: Diagnosis not present

## 2021-06-03 HISTORY — PX: OVUM / OOCYTE RETRIEVAL: SUR1269

## 2021-06-30 ENCOUNTER — Other Ambulatory Visit: Payer: Self-pay | Admitting: Obstetrics and Gynecology

## 2021-07-11 IMAGING — US US OB TRANSVAGINAL
1 series · 15 of 28 positions shown · non-contrast
Comparison: None.

CLINICAL DATA: Miscarriage in progress.

EXAM:
TRANSVAGINAL OB ULTRASOUND
TECHNIQUE: Transvaginal ultrasound was performed for complete evaluation of the
gestation as well as the maternal uterus, adnexal regions, and
pelvic cul-de-sac.

[Series 1: us ob transvaginal · 15 of 29 slices shown]
[im 1/29]
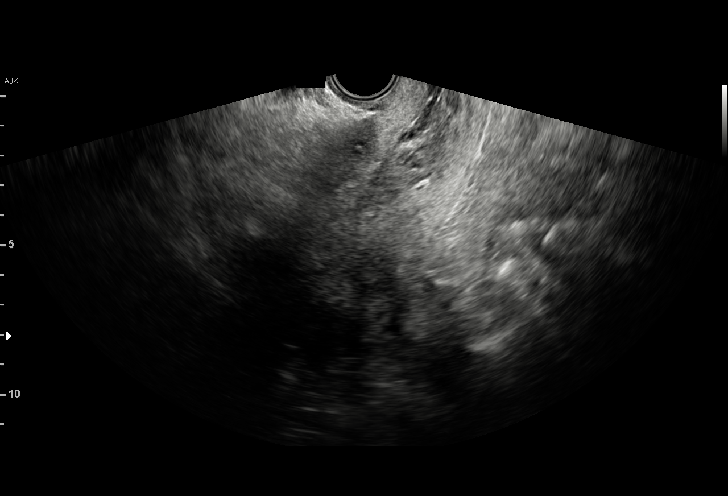
[im 3/29]
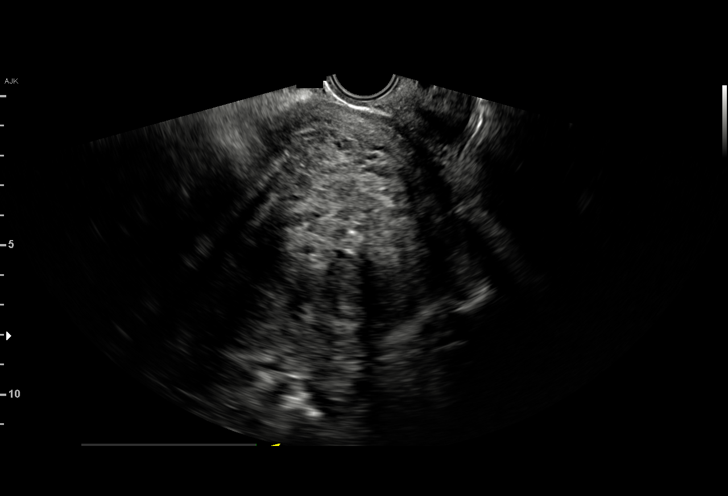
[im 5/29]
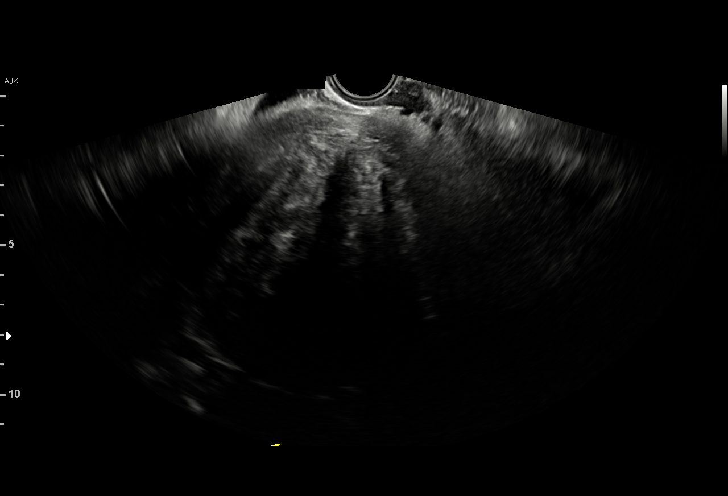
[im 7/29]
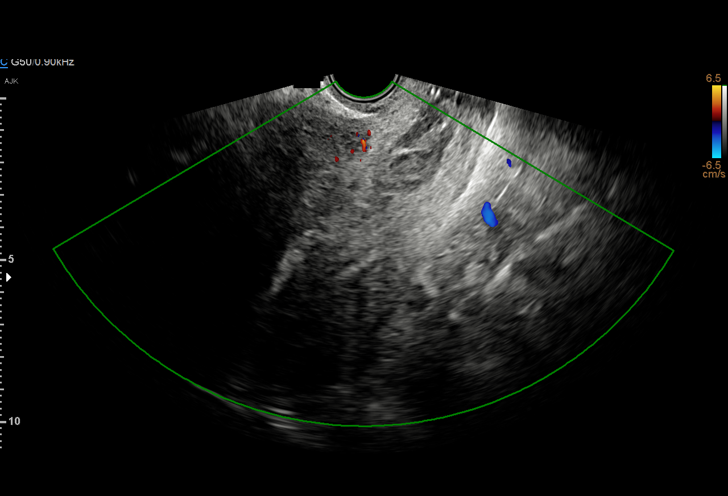
[im 9/29]
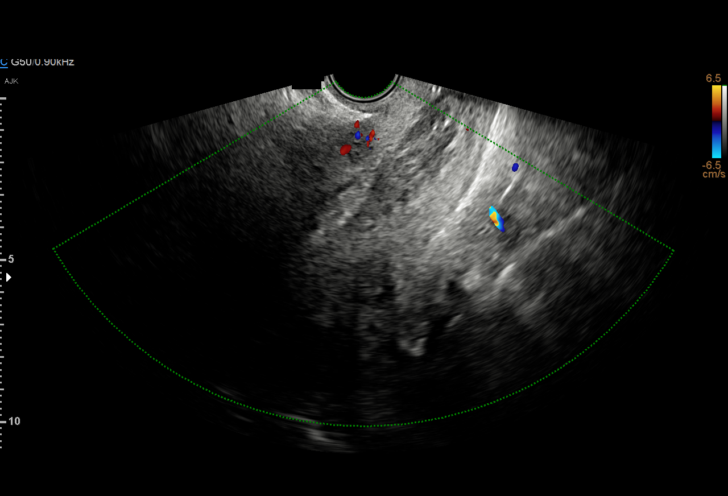
[im 11/29]
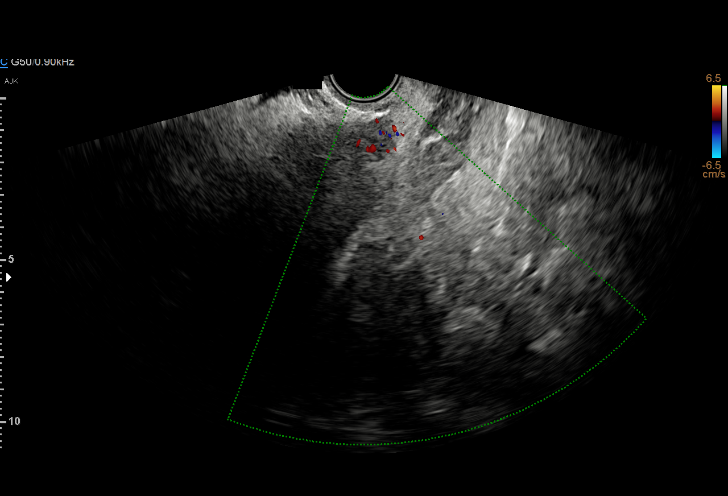
[im 13/29]
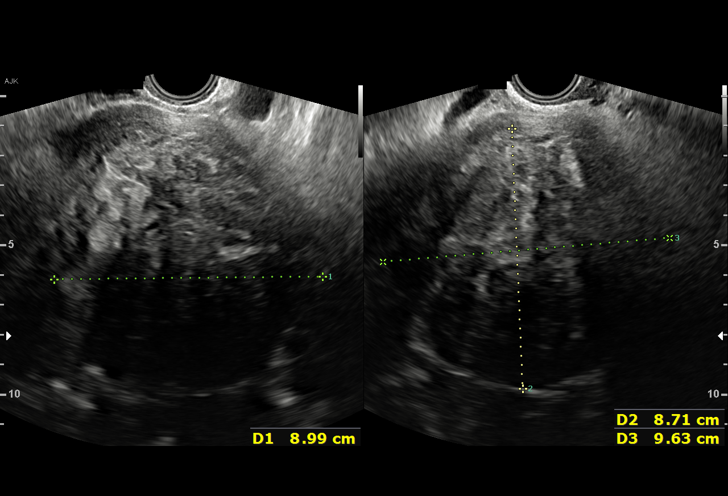
[im 15/29]
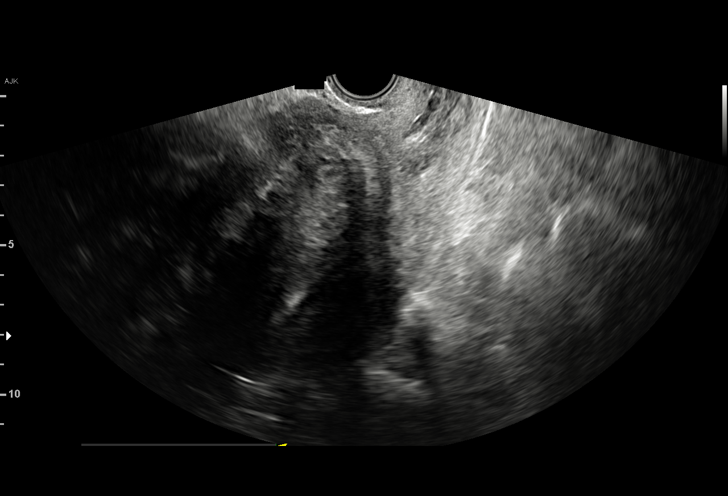
[im 16/29]
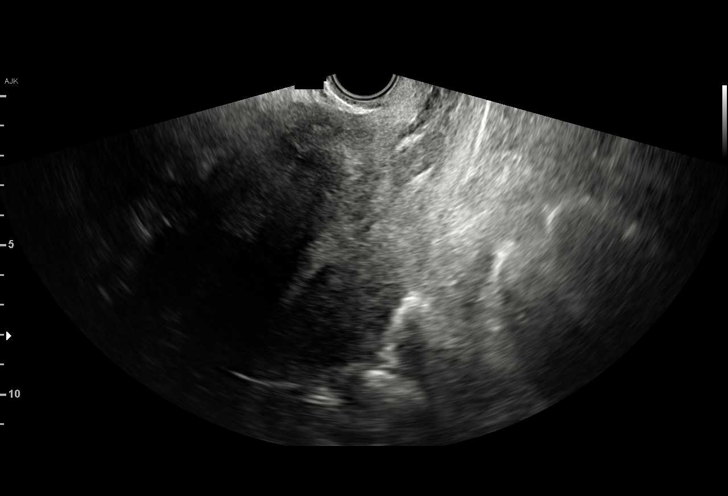
[im 18/29]
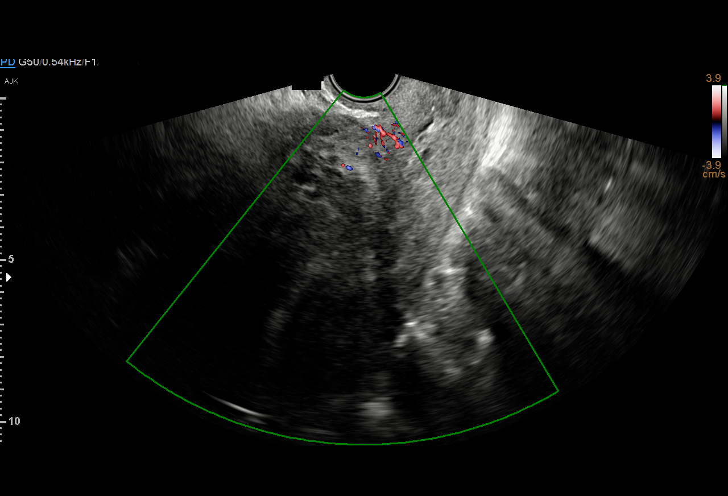
[im 20/29]
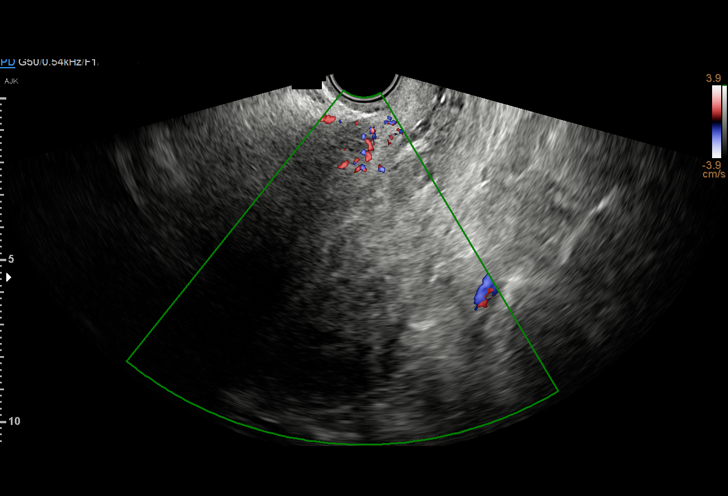
[im 22/29]
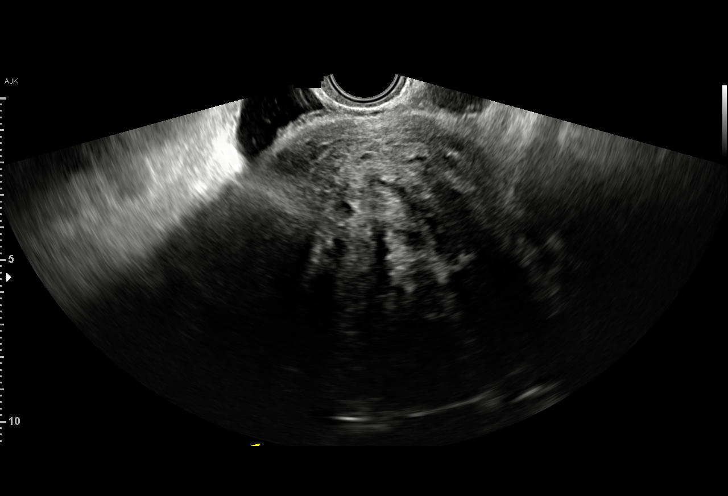
[im 24/29]
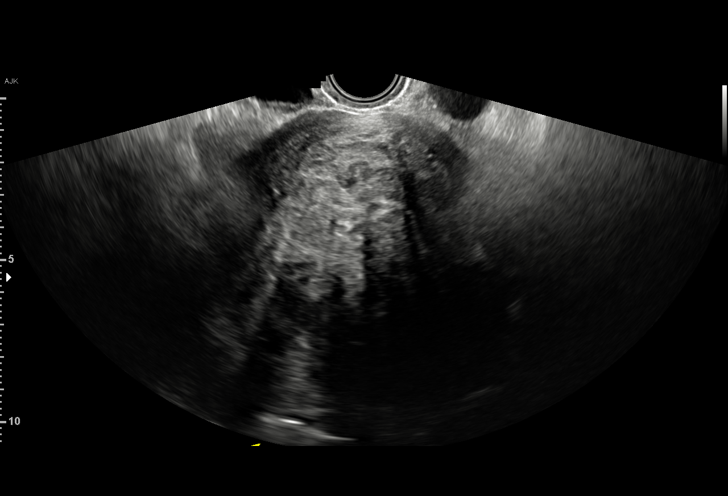
[im 26/29]
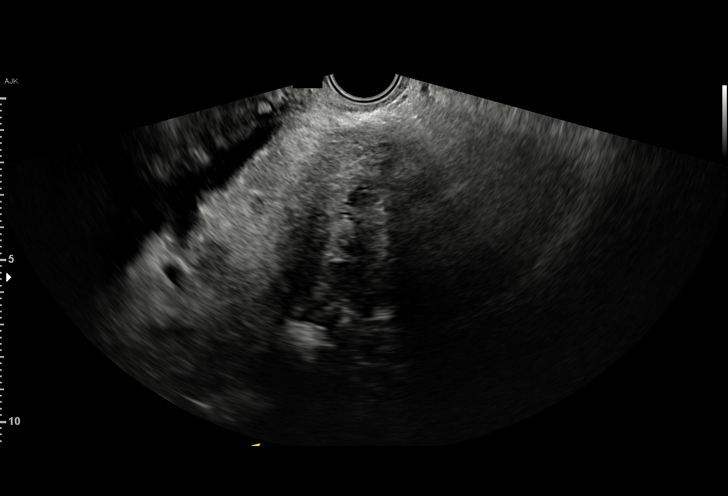
[im 29/29]
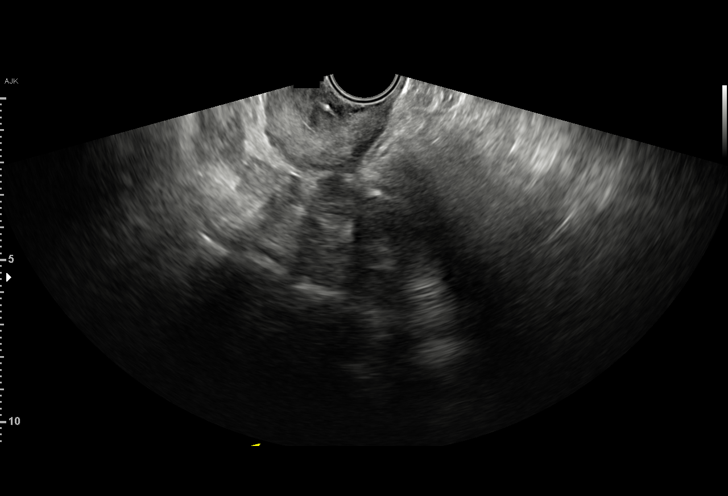

[15 of 28 positions shown; findings below may reference images not displayed]

FINDINGS: Intrauterine gestational sac: None

Yolk sac:  Not Visualized.

Embryo:  Not Visualized.

Cardiac Activity: Not Visualized.

Subchorionic hemorrhage:  None visualized.

Maternal uterus/adnexae: Ovaries are not visualized. Large fibroid
measuring 9.6 x 9.0 x 8.7 cm is noted. No free fluid is noted.
IMPRESSION: No intrauterine gestational sac, yolk sac, fetal pole, or cardiac
activity visualized. Differential considerations include
intrauterine gestation too early to be sonographically visualized,
spontaneous abortion, or ectopic pregnancy. Consider follow-up
ultrasound in 14 days and serial quantitative beta HCG follow-up.

## 2021-08-10 ENCOUNTER — Encounter (HOSPITAL_BASED_OUTPATIENT_CLINIC_OR_DEPARTMENT_OTHER): Payer: Self-pay | Admitting: Obstetrics and Gynecology

## 2021-08-11 ENCOUNTER — Other Ambulatory Visit: Payer: Self-pay

## 2021-08-11 ENCOUNTER — Encounter (HOSPITAL_BASED_OUTPATIENT_CLINIC_OR_DEPARTMENT_OTHER): Payer: Self-pay | Admitting: Obstetrics and Gynecology

## 2021-08-11 NOTE — Progress Notes (Signed)
Spoke w/ via phone for pre-op interview--- pt ?Lab needs dos----  t&s, urine preg             ?Lab results------ no ?COVID test -----patient states asymptomatic no test needed ?Arrive at ------- 1030 on 08-12-2021 ?NPO after MN NO Solid Food.  Clear liquids from MN until--- 0930 ?Med rec completed ?Medications to take morning of surgery ----- wellbutrin, buspar ?Diabetic medication ----- n/a ?Patient instructed no nail polish to be worn day of surgery ?Patient instructed to bring photo id and insurance card day of surgery ?Patient aware to have Driver (ride ) / caregiver for 24 hours after surgery -- husband, Carrie Blanchard ?Patient Special Instructions ----- n/a ?Pre-Op special Istructions ----- n/a ?Patient verbalized understanding of instructions that were given at this phone interview. ?Patient denies shortness of breath, chest pain, fever, cough at this phone interview.  ?

## 2021-08-12 ENCOUNTER — Other Ambulatory Visit: Payer: Self-pay

## 2021-08-12 ENCOUNTER — Encounter (HOSPITAL_BASED_OUTPATIENT_CLINIC_OR_DEPARTMENT_OTHER)
Admission: RE | Disposition: A | Discharge status: Home / Self Care | Payer: Self-pay | Source: Home / Self Care | Attending: Obstetrics and Gynecology

## 2021-08-12 ENCOUNTER — Encounter (HOSPITAL_BASED_OUTPATIENT_CLINIC_OR_DEPARTMENT_OTHER): Payer: Self-pay | Admitting: Obstetrics and Gynecology

## 2021-08-12 ENCOUNTER — Ambulatory Visit (HOSPITAL_BASED_OUTPATIENT_CLINIC_OR_DEPARTMENT_OTHER): Payer: BC Managed Care – PPO | Admitting: Anesthesiology

## 2021-08-12 ENCOUNTER — Ambulatory Visit (HOSPITAL_BASED_OUTPATIENT_CLINIC_OR_DEPARTMENT_OTHER)
Admission: RE | Admit: 2021-08-12 | Discharge: 2021-08-12 | Disposition: A | Discharge status: Home / Self Care | Payer: BC Managed Care – PPO | Attending: Obstetrics and Gynecology | Admitting: Obstetrics and Gynecology

## 2021-08-12 DIAGNOSIS — F419 Anxiety disorder, unspecified: Secondary | ICD-10-CM | POA: Diagnosis not present

## 2021-08-12 DIAGNOSIS — F32A Depression, unspecified: Secondary | ICD-10-CM | POA: Insufficient documentation

## 2021-08-12 DIAGNOSIS — N803 Endometriosis of pelvic peritoneum, unspecified: Secondary | ICD-10-CM | POA: Insufficient documentation

## 2021-08-12 DIAGNOSIS — D252 Subserosal leiomyoma of uterus: Secondary | ICD-10-CM | POA: Diagnosis not present

## 2021-08-12 DIAGNOSIS — D251 Intramural leiomyoma of uterus: Secondary | ICD-10-CM | POA: Insufficient documentation

## 2021-08-12 DIAGNOSIS — Z87891 Personal history of nicotine dependence: Secondary | ICD-10-CM | POA: Diagnosis not present

## 2021-08-12 DIAGNOSIS — N736 Female pelvic peritoneal adhesions (postinfective): Secondary | ICD-10-CM | POA: Diagnosis not present

## 2021-08-12 DIAGNOSIS — N920 Excessive and frequent menstruation with regular cycle: Secondary | ICD-10-CM | POA: Diagnosis not present

## 2021-08-12 DIAGNOSIS — N979 Female infertility, unspecified: Secondary | ICD-10-CM | POA: Diagnosis not present

## 2021-08-12 DIAGNOSIS — Z6841 Body Mass Index (BMI) 40.0 and over, adult: Secondary | ICD-10-CM | POA: Insufficient documentation

## 2021-08-12 DIAGNOSIS — N80102 Endometriosis of left ovary, unspecified depth: Secondary | ICD-10-CM | POA: Diagnosis not present

## 2021-08-12 HISTORY — DX: Female infertility, unspecified: N97.9

## 2021-08-12 HISTORY — PX: LAPAROSCOPIC GELPORT ASSISTED MYOMECTOMY: SHX6549

## 2021-08-12 HISTORY — DX: Generalized anxiety disorder: F41.1

## 2021-08-12 HISTORY — DX: Major depressive disorder, single episode, unspecified: F32.9

## 2021-08-12 HISTORY — DX: Frequency of micturition: R35.0

## 2021-08-12 HISTORY — DX: Presence of spectacles and contact lenses: Z97.3

## 2021-08-12 HISTORY — DX: Leiomyoma of uterus, unspecified: D25.9

## 2021-08-12 LAB — TYPE AND SCREEN
ABO/RH(D): O POS
Antibody Screen: NEGATIVE

## 2021-08-12 LAB — POCT PREGNANCY, URINE: Preg Test, Ur: NEGATIVE

## 2021-08-12 SURGERY — LAPAROSCOPIC GELPORT ASSISTED MYOMECTOMY
Anesthesia: General | Site: Abdomen

## 2021-08-12 MED ORDER — OXYCODONE HCL 5 MG PO TABS
5.0000 mg | ORAL_TABLET | Freq: Once | ORAL | Status: AC
Start: 2021-08-12 — End: 2021-08-12
  Administered 2021-08-12: 5 mg via ORAL

## 2021-08-12 MED ORDER — CEFAZOLIN SODIUM 1 G IJ SOLR
INTRAMUSCULAR | Status: AC
  Filled 2021-08-12: qty 10

## 2021-08-12 MED ORDER — FENTANYL CITRATE (PF) 100 MCG/2ML IJ SOLN
INTRAMUSCULAR | Status: AC
  Filled 2021-08-12: qty 2

## 2021-08-12 MED ORDER — SCOPOLAMINE 1 MG/3DAYS TD PT72
MEDICATED_PATCH | TRANSDERMAL | Status: AC
Start: 2021-08-12 — End: ?
  Filled 2021-08-12: qty 1

## 2021-08-12 MED ORDER — MIDAZOLAM HCL 2 MG/2ML IJ SOLN
INTRAMUSCULAR | Status: AC
Start: 2021-08-12 — End: ?
  Filled 2021-08-12: qty 2

## 2021-08-12 MED ORDER — PHENYLEPHRINE HCL (PRESSORS) 10 MG/ML IV SOLN
INTRAVENOUS | Status: DC | PRN
Start: 2021-08-12 — End: 2021-08-12
  Administered 2021-08-12: 80 ug via INTRAVENOUS

## 2021-08-12 MED ORDER — VASOPRESSIN 20 UNIT/ML IV SOLN
INTRAVENOUS | Status: DC | PRN
  Administered 2021-08-12: 20 mL via INTRAMUSCULAR

## 2021-08-12 MED ORDER — DEXAMETHASONE SODIUM PHOSPHATE 10 MG/ML IJ SOLN
INTRAMUSCULAR | Status: AC
Start: 2021-08-12 — End: ?
  Filled 2021-08-12: qty 1

## 2021-08-12 MED ORDER — FENTANYL CITRATE (PF) 100 MCG/2ML IJ SOLN
25.0000 ug | INTRAMUSCULAR | Status: DC | PRN
  Administered 2021-08-12: 25 ug via INTRAVENOUS

## 2021-08-12 MED ORDER — MIDAZOLAM HCL 5 MG/5ML IJ SOLN
INTRAMUSCULAR | Status: DC | PRN
  Administered 2021-08-12: 2 mg via INTRAVENOUS

## 2021-08-12 MED ORDER — ONDANSETRON HCL 4 MG PO TABS: 4.0000 mg | ORAL_TABLET | Freq: Every day | ORAL | 1 refills | Status: AC | PRN

## 2021-08-12 MED ORDER — DEXAMETHASONE SODIUM PHOSPHATE 4 MG/ML IJ SOLN
INTRAMUSCULAR | Status: DC | PRN
  Administered 2021-08-12: 10 mg via INTRAVENOUS

## 2021-08-12 MED ORDER — ACETAMINOPHEN 500 MG PO TABS
1000.0000 mg | ORAL_TABLET | Freq: Once | ORAL | Status: AC
  Administered 2021-08-12: 1000 mg via ORAL

## 2021-08-12 MED ORDER — CEFAZOLIN SODIUM-DEXTROSE 1-4 GM/50ML-% IV SOLN
INTRAVENOUS | Status: DC | PRN
  Administered 2021-08-12: 3 g via INTRAVENOUS

## 2021-08-12 MED ORDER — PROPOFOL 10 MG/ML IV BOLUS
INTRAVENOUS | Status: AC
  Filled 2021-08-12: qty 20

## 2021-08-12 MED ORDER — PROMETHAZINE HCL 25 MG/ML IJ SOLN: 6.2500 mg | INTRAMUSCULAR | Status: DC | PRN

## 2021-08-12 MED ORDER — PHENYLEPHRINE 80 MCG/ML (10ML) SYRINGE FOR IV PUSH (FOR BLOOD PRESSURE SUPPORT)
PREFILLED_SYRINGE | INTRAVENOUS | Status: AC
  Filled 2021-08-12: qty 10

## 2021-08-12 MED ORDER — ACETAMINOPHEN 500 MG PO TABS
ORAL_TABLET | ORAL | Status: AC
  Filled 2021-08-12: qty 2

## 2021-08-12 MED ORDER — ROCURONIUM BROMIDE 100 MG/10ML IV SOLN
INTRAVENOUS | Status: DC | PRN
  Administered 2021-08-12: 10 mg via INTRAVENOUS
  Administered 2021-08-12: 70 mg via INTRAVENOUS

## 2021-08-12 MED ORDER — SODIUM CHLORIDE 0.9 % IR SOLN
Status: DC | PRN
  Administered 2021-08-12: 1000 mL

## 2021-08-12 MED ORDER — PHENYLEPHRINE HCL (PRESSORS) 10 MG/ML IV SOLN
INTRAVENOUS | Status: AC
Start: 2021-08-12 — End: ?
  Filled 2021-08-12: qty 1

## 2021-08-12 MED ORDER — LACTATED RINGERS IV SOLN
INTRAVENOUS | Status: DC
  Administered 2021-08-12: 1000 mL via INTRAVENOUS

## 2021-08-12 MED ORDER — FENTANYL CITRATE (PF) 100 MCG/2ML IJ SOLN
INTRAMUSCULAR | Status: DC | PRN
  Administered 2021-08-12: 25 ug via INTRAVENOUS
  Administered 2021-08-12 (×4): 50 ug via INTRAVENOUS
  Administered 2021-08-12: 25 ug via INTRAVENOUS

## 2021-08-12 MED ORDER — POVIDONE-IODINE 10 % EX SWAB: 2.0000 "application " | Freq: Once | CUTANEOUS | Status: DC

## 2021-08-12 MED ORDER — CELECOXIB 200 MG PO CAPS
ORAL_CAPSULE | ORAL | Status: AC
  Filled 2021-08-12: qty 1

## 2021-08-12 MED ORDER — CEFAZOLIN SODIUM-DEXTROSE 1-4 GM/50ML-% IV SOLN
INTRAVENOUS | Status: AC
  Filled 2021-08-12: qty 50

## 2021-08-12 MED ORDER — CELECOXIB 200 MG PO CAPS
200.0000 mg | ORAL_CAPSULE | Freq: Once | ORAL | Status: AC
Start: 2021-08-12 — End: 2021-08-12
  Administered 2021-08-12: 200 mg via ORAL

## 2021-08-12 MED ORDER — ONDANSETRON HCL 4 MG/2ML IJ SOLN
INTRAMUSCULAR | Status: DC | PRN
  Administered 2021-08-12: 4 mg via INTRAVENOUS

## 2021-08-12 MED ORDER — OXYCODONE HCL 5 MG PO TABS
ORAL_TABLET | ORAL | Status: AC
  Filled 2021-08-12: qty 1

## 2021-08-12 MED ORDER — LIDOCAINE HCL (CARDIAC) PF 100 MG/5ML IV SOSY
PREFILLED_SYRINGE | INTRAVENOUS | Status: DC | PRN
  Administered 2021-08-12: 100 mg via INTRAVENOUS

## 2021-08-12 MED ORDER — ONDANSETRON HCL 4 MG/2ML IJ SOLN
INTRAMUSCULAR | Status: AC
  Filled 2021-08-12: qty 2

## 2021-08-12 MED ORDER — 0.9 % SODIUM CHLORIDE (POUR BTL) OPTIME
TOPICAL | Status: DC | PRN
  Administered 2021-08-12: 1000 mL

## 2021-08-12 MED ORDER — SCOPOLAMINE 1 MG/3DAYS TD PT72
1.0000 | MEDICATED_PATCH | TRANSDERMAL | Status: DC
  Administered 2021-08-12: 1.5 mg via TRANSDERMAL

## 2021-08-12 MED ORDER — BUPIVACAINE-EPINEPHRINE 0.5% -1:200000 IJ SOLN
INTRAMUSCULAR | Status: DC | PRN
Start: 2021-08-12 — End: 2021-08-12
  Administered 2021-08-12: 20 mL

## 2021-08-12 MED ORDER — PROPOFOL 10 MG/ML IV BOLUS
INTRAVENOUS | Status: DC | PRN
  Administered 2021-08-12: 200 mg via INTRAVENOUS

## 2021-08-12 MED ORDER — SUCCINYLCHOLINE CHLORIDE 200 MG/10ML IV SOSY
PREFILLED_SYRINGE | INTRAVENOUS | Status: AC
  Filled 2021-08-12: qty 10

## 2021-08-12 MED ORDER — METHYLENE BLUE 1 % INJ SOLN
INTRAVENOUS | Status: DC | PRN
  Administered 2021-08-12: 120 mL

## 2021-08-12 MED ORDER — AMISULPRIDE (ANTIEMETIC) 5 MG/2ML IV SOLN: 10.0000 mg | Freq: Once | INTRAVENOUS | Status: DC | PRN

## 2021-08-12 MED ORDER — OXYCODONE-ACETAMINOPHEN 7.5-325 MG PO TABS: 1.0000 | ORAL_TABLET | ORAL | 0 refills | Status: DC | PRN

## 2021-08-12 MED ORDER — ROCURONIUM BROMIDE 10 MG/ML (PF) SYRINGE
PREFILLED_SYRINGE | INTRAVENOUS | Status: AC
  Filled 2021-08-12: qty 10

## 2021-08-12 MED ORDER — SUGAMMADEX SODIUM 200 MG/2ML IV SOLN
INTRAVENOUS | Status: DC | PRN
  Administered 2021-08-12: 250 mg via INTRAVENOUS

## 2021-08-12 MED ORDER — SUCCINYLCHOLINE CHLORIDE 200 MG/10ML IV SOSY
PREFILLED_SYRINGE | INTRAVENOUS | Status: DC | PRN
  Administered 2021-08-12: 160 mg via INTRAVENOUS

## 2021-08-12 MED ORDER — LIDOCAINE HCL (PF) 2 % IJ SOLN
INTRAMUSCULAR | Status: AC
  Filled 2021-08-12: qty 5

## 2021-08-12 SURGICAL SUPPLY — 71 items
ADH SKN CLS APL DERMABOND .7 (GAUZE/BANDAGES/DRESSINGS) ×1
BAG RETRIEVAL 10 (BASKET)
BLADE EXTENDED COATED 6.5IN (ELECTRODE) IMPLANT
BLADE SURG 10 STRL SS (BLADE) ×4 IMPLANT
BRR ADH 6X5 SEPRAFILM 1 SHT (MISCELLANEOUS) ×1
CABLE HIGH FREQUENCY MONO STRZ (ELECTRODE) ×2 IMPLANT
CLEANER CAUTERY TIP 5X5 PAD (MISCELLANEOUS) ×1 IMPLANT
COVER MAYO STAND STRL (DRAPES) ×2 IMPLANT
DERMABOND ADVANCED (GAUZE/BANDAGES/DRESSINGS) ×1
DERMABOND ADVANCED .7 DNX12 (GAUZE/BANDAGES/DRESSINGS) ×1 IMPLANT
DRSG OPSITE POSTOP 3X4 (GAUZE/BANDAGES/DRESSINGS) ×1 IMPLANT
DRSG TEGADERM 4X4.75 (GAUZE/BANDAGES/DRESSINGS) IMPLANT
DRSG TELFA 3X8 NADH (GAUZE/BANDAGES/DRESSINGS) ×2 IMPLANT
DURAPREP 26ML APPLICATOR (WOUND CARE) ×2 IMPLANT
ELECT NDL TIP 2.8 STRL (NEEDLE) IMPLANT
ELECT NEEDLE TIP 2.8 STRL (NEEDLE) IMPLANT
ELECT REM PT RETURN 9FT ADLT (ELECTROSURGICAL) ×2
ELECTRODE REM PT RTRN 9FT ADLT (ELECTROSURGICAL) ×1 IMPLANT
GAUZE 4X4 16PLY ~~LOC~~+RFID DBL (SPONGE) ×4 IMPLANT
GLOVE BIO SURGEON STRL SZ8 (GLOVE) ×4 IMPLANT
GOWN STRL REUS W/TWL XL LVL3 (GOWN DISPOSABLE) ×4 IMPLANT
HOLDER FOLEY CATH W/STRAP (MISCELLANEOUS) IMPLANT
KIT TURNOVER CYSTO (KITS) ×2 IMPLANT
MANIPULATOR UTERINE 4.5 ZUMI (MISCELLANEOUS) ×2 IMPLANT
NDL SAFETY ECLIPSE 18X1.5 (NEEDLE) IMPLANT
NEEDLE HYPO 18GX1.5 SHARP (NEEDLE)
NEEDLE HYPO 22GX1.5 SAFETY (NEEDLE) ×4 IMPLANT
NEEDLE INSUFFLATION 120MM (ENDOMECHANICALS) ×2 IMPLANT
NS IRRIG 500ML POUR BTL (IV SOLUTION) ×2 IMPLANT
PACK LAPAROSCOPY BASIN (CUSTOM PROCEDURE TRAY) ×2 IMPLANT
PACK TRENDGUARD 450 HYBRID PRO (MISCELLANEOUS) IMPLANT
PAD CLEANER CAUTERY TIP 5X5 (MISCELLANEOUS) ×1
PAD DRESSING TELFA 3X8 NADH (GAUZE/BANDAGES/DRESSINGS) ×1 IMPLANT
PAD OB MATERNITY 4.3X12.25 (PERSONAL CARE ITEMS) ×2 IMPLANT
PENCIL SMOKE EVACUATOR (MISCELLANEOUS) ×2 IMPLANT
SEPRAFILM MEMBRANE 5X6 (MISCELLANEOUS) ×1 IMPLANT
SET SUCTION IRRIG HYDROSURG (IRRIGATION / IRRIGATOR) ×2 IMPLANT
SET TRI-LUMEN FLTR TB AIRSEAL (TUBING) IMPLANT
SET TUBE SMOKE EVAC HIGH FLOW (TUBING) IMPLANT
SHEARS HARMONIC ACE PLUS 36CM (ENDOMECHANICALS) IMPLANT
SPONGE T-LAP 18X18 ~~LOC~~+RFID (SPONGE) IMPLANT
SPONGE T-LAP 4X18 ~~LOC~~+RFID (SPONGE) ×2 IMPLANT
STOPCOCK 4 WAY LG BORE MALE ST (IV SETS) IMPLANT
SUT MNCRL AB 4-0 PS2 18 (SUTURE) ×3 IMPLANT
SUT PLAIN 2 0 XLH (SUTURE) ×1 IMPLANT
SUT VIC AB 0 CT1 36 (SUTURE) IMPLANT
SUT VIC AB 2-0 CT1 (SUTURE) ×4 IMPLANT
SUT VIC AB 2-0 CT2 27 (SUTURE) ×2 IMPLANT
SUT VIC AB 2-0 UR6 27 (SUTURE) IMPLANT
SUT VIC AB 3-0 SH 27 (SUTURE) ×2
SUT VIC AB 3-0 SH 27X BRD (SUTURE) IMPLANT
SUT VIC AB 4-0 SH 27 (SUTURE) ×4
SUT VIC AB 4-0 SH 27XANBCTRL (SUTURE) ×2 IMPLANT
SYR 30ML LL (SYRINGE) ×2 IMPLANT
SYR 50ML LL SCALE MARK (SYRINGE) IMPLANT
SYR 5ML LL (SYRINGE) ×2 IMPLANT
SYR CONTROL 10ML LL (SYRINGE) ×4 IMPLANT
SYR TOOMEY IRRIG 70ML (MISCELLANEOUS) ×2
SYRINGE TOOMEY IRRIG 70ML (MISCELLANEOUS) IMPLANT
SYS BAG RETRIEVAL 10MM (BASKET)
SYS LAPSCP GELPORT 120MM (MISCELLANEOUS)
SYSTEM BAG RETRIEVAL 10MM (BASKET) IMPLANT
SYSTEM LAPSCP GELPORT 120MM (MISCELLANEOUS) IMPLANT
TOWEL OR 17X26 10 PK STRL BLUE (TOWEL DISPOSABLE) ×2 IMPLANT
TRAY FOLEY W/BAG SLVR 14FR LF (SET/KITS/TRAYS/PACK) ×2 IMPLANT
TRENDGUARD 450 HYBRID PRO PACK (MISCELLANEOUS)
TROCAR OPTI TIP 5M 100M (ENDOMECHANICALS) ×5 IMPLANT
TROCAR PORT AIRSEAL 5X120 (TROCAR) IMPLANT
TROCAR XCEL DIL TIP R 11M (ENDOMECHANICALS) IMPLANT
WARMER LAPAROSCOPE (MISCELLANEOUS) ×2 IMPLANT
WATER STERILE IRR 500ML POUR (IV SOLUTION) ×2 IMPLANT

## 2021-08-12 NOTE — H&P (Signed)
Carrie Blanchard is a 41 y.o. female , originally referred to me by Dr. Paula Compton, for laparoscopic myomectomy and infertility treatment.  She was diagnosed with fibroids because of abnormal uterine bleeding.  Saline contrast sonohysterogram showed a 5.6 x 4.7 x 4.9 cm anterior transmural myoma distorting the endometrial cavity.  She has been having monthly periods but with heavy flow and prolonged duration.  Patient would like to preserve her childbearing potential. ? ?Pertinent Gynecological History: ?Menses: flow is excessive with use of 3 pads or tampons on heaviest days ?Bleeding: dysfunctional uterine bleeding ?Contraception: none ?DES exposure: denies ?Blood transfusions: none ?Sexually transmitted diseases: no past history ? ?Last pap: normal   ? ?Menstrual History: ?Menarche age: 26 ?No LMP recorded. ?  ? ?Past Medical History:  ?Diagnosis Date  ? Chronic tension headaches   ? Frequency of urination   ? GAD (generalized anxiety disorder)   ? History of COVID-19 2022  ? per pt mild to moderate symptoms that resolved  ? History of palpitations in adulthood 01/11/2012  ? Infertility, female   ? MDD (major depressive disorder)   ? Polycystic ovarian syndrome   ? Uterine myoma   ? Wears glasses   ?  ?   ?    ?    ?    ? ?Past Surgical History:  ?Procedure Laterality Date  ? OVARIAN CYST REMOVAL    ? age 52;   Large  (Grapefruit size per pt)  ? OVUM / OOCYTE RETRIEVAL  06/2021  ? ?  ?   ?    ? ?Family History  ?Problem Relation Age of Onset  ? Arthritis Mother   ?     "rheumatoid" per pt report  ? Diabetes Father   ? Cancer Maternal Grandmother   ?     breast  ? No hereditary disease.  No cancer of breast, ovary, uterus. No cutaneous leiomyomatosis or renal cell carcinoma. ? ?Social History  ? ?Socioeconomic History  ? Marital status: Married  ?  Spouse name: Not on file  ? Number of children: Not on file  ? Years of education: Not on file  ? Highest education level: Not on file  ?Occupational History  ?  Not on file  ?Tobacco Use  ? Smoking status: Former  ?  Packs/day: 0.50  ?  Years: 15.00  ?  Pack years: 7.50  ?  Types: Cigarettes  ?  Quit date: 10/15/2020  ?  Years since quitting: 0.8  ? Smokeless tobacco: Never  ?Vaping Use  ? Vaping Use: Never used  ?Substance and Sexual Activity  ? Alcohol use: Not Currently  ?  Comment: rare  ? Drug use: No  ? Sexual activity: Not on file  ?Other Topics Concern  ? Not on file  ?Social History Narrative  ? Divorced.  Remarried 2019, no children.    ? 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  ? BS/BA from [employer] in [facility].  ? Tobacco 5 pack-yr history.  Occasional alcohol use.  No drug use.  ? No exercise.  ? ?Social Determinants of Health  ? ?Financial Resource Strain: Not on file  ?Food Insecurity: Not on file  ?Transportation Needs: Not on file  ?Physical Activity: Not on file  ?Stress: Not on file  ?Social Connections: Not on file  ?Intimate Partner Violence: Not on file  ? ? ?Not on File ? ?No current facility-administered medications on file prior to encounter.  ? ?  Current Outpatient Medications on File Prior to Encounter  ?Medication Sig Dispense Refill  ? aspirin EC 81 MG tablet Take 81 mg by mouth daily. Swallow whole.    ? buPROPion (WELLBUTRIN XL) 150 MG 24 hr tablet Take 150 mg by mouth daily.    ? busPIRone (BUSPAR) 5 MG tablet Take 1 tablet (5 mg total) by mouth 3 (three) times daily. Pt takes BID. (Patient taking differently: Take 5 mg by mouth 2 (two) times daily.) 270 tablet 1  ? Coenzyme Q10 (COQ10 PO) Take by mouth daily.    ? FOLIC ACID PO Take by mouth daily.    ? INOSITOL PO Take 4 tablets by mouth daily.    ? MAGNESIUM PO Take by mouth daily.    ? metFORMIN (GLUCOPHAGE) 500 MG tablet Take 1 tablet (500 mg total) by mouth 2 (two) times daily with a meal. (Patient taking differently: Take 500 mg by mouth 2 (two) times daily with a meal.) 180 tablet 0  ? Multiple Vitamins-Minerals (ZINC PO) Take by mouth  daily.    ? Prenatal Vit-Fe Fumarate-FA (MULTIVITAMIN-PRENATAL) 27-0.8 MG TABS tablet Take 1 tablet by mouth daily at 12 noon.    ? ?  ?Review of Systems  ?Constitutional: Negative.   ?HENT: Negative.   ?Eyes: Negative.   ?Respiratory: Negative.   ?Cardiovascular: Negative.   ?Gastrointestinal: Negative.   ?Genitourinary: Negative.   ?Musculoskeletal: Negative.   ?Skin: Negative.   ?Neurological: Negative.   ?Endo/Heme/Allergies: Negative.   ?Psychiatric/Behavioral: Negative.   ? ? ? ?Physical Exam  ?BP (!) 148/89   Pulse 74   Temp 98.1 ?F (36.7 ?C) (Oral)   Resp 18   Ht '5\' 7"'$  (1.702 m)   Wt 117.3 kg   LMP 08/11/2021   SpO2 97%   BMI 40.52 kg/m?  ?Constitutional: She is oriented to person, place, and time. She appears well-developed and well-nourished.  ?HENT:  ?Head: Normocephalic and atraumatic.  ?Nose: Nose normal.  ?Mouth/Throat: Oropharynx is clear and moist. No oropharyngeal exudate.  ?Eyes: Conjunctivae normal and EOM are normal. Pupils are equal, round, and reactive to light. No scleral icterus.  ?Neck: Normal range of motion. Neck supple. No tracheal deviation present. No thyromegaly present.  ?Cardiovascular: Normal rate.   ?Respiratory: Effort normal and breath sounds normal.  ?GI: Soft. Bowel sounds are normal. She exhibits no distension and no mass. There is no tenderness.  ?Lymphadenopathy:  ?  She has no cervical adenopathy.  ?Neurological: She is alert and oriented to person, place, and time. She has normal reflexes.  ?Skin: Skin is warm.  ?Psychiatric: She has a normal mood and affect. Her behavior is normal. Judgment and thought content normal.  ? ? ?Assessment/Plan: ? 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 Preoperative for laparoscopic GelPort assisted myomectomy ?Benefits and risks of the proposed procedure were discussed with the patient and her family member again.  Bowel prep  instructions were given.  All of patient's questions were answered.  She verbalized understanding.  She knows that she will need a cesarean delivery for future pregnancies, and that it is recommended she does not conceive for 2-3 months for uterus to heal.  ? ?Governor Specking, MD ? ?

## 2021-08-12 NOTE — Discharge Instructions (Addendum)
?Post Anesthesia Home Care Instructions ? ?Activity: ?Get plenty of rest for the remainder of the day. A responsible individual must stay with you for 24 hours following the procedure.  ?For the next 24 hours, DO NOT: ?-Drive a car ?-Paediatric nurse ?-Drink alcoholic beverages ?-Take any medication unless instructed by your physician ?-Make any legal decisions or sign important papers. ? ?Meals: ?Start with liquid foods such as gelatin or soup. Progress to regular foods as tolerated. Avoid greasy, spicy, heavy foods. If nausea and/or vomiting occur, drink only clear liquids until the nausea and/or vomiting subsides. Call your physician if vomiting continues. ? ?Special Instructions/Symptoms: ?Your throat may feel dry or sore from the anesthesia or the breathing tube placed in your throat during surgery. If this causes discomfort, gargle with warm salt water. The discomfort should disappear within 24 hours. ? ?If you had a scopolamine patch placed behind your ear for the management of post- operative nausea and/or vomiting: ? ?1. The medication in the patch is effective for 72 hours, after which it should be removed.  Wrap patch in a tissue and discard in the trash. Wash hands thoroughly with soap and water. ?2. You may remove the patch earlier than 72 hours if you experience unpleasant side effects which may include dry mouth, dizziness or visual disturbances. ?3. Avoid touching the patch. Wash your hands with soap and water after contact with the patch. ?    ?DISCHARGE INSTRUCTIONS: Laparoscopy ? ?The following instructions have been prepared to help you care for yourself upon your return home today. ? ?Wound care: ? Do not get the incision wet for the first 24 hours. The incision should be kept clean and dry. ? The Band-Aids or dressings may be removed the day after surgery. ? Should the incision become sore, red, and swollen after the first week, check with your doctor. ? ?Personal hygiene: ? Shower the day  after your procedure. ? ?Activity and limitations: ? Do NOT drive or operate any equipment today. ? Do NOT lift anything more than 15 pounds for 2-3 weeks after surgery. ? Do NOT rest in bed all day. ? Walking is encouraged. Walk each day, starting slowly with 5-minute walks 3 or 4 times a day. Slowly increase the length of your walks. ? Walk up and down stairs slowly. ? Do NOT do strenuous activities, such as golfing, playing tennis, bowling, running, biking, weight lifting, gardening, mowing, or vacuuming for 2-4 weeks. Ask your doctor when it is okay to start. ? ?Diet: Eat a light meal as desired this evening. You may resume your usual diet tomorrow. ? ?Return to work: This is dependent on the type of work you do. For the most part you can return to a desk job within a week of surgery. If you are more active at work, please discuss this with your doctor. ? ?What to expect after your surgery: You may have a slight burning sensation when you urinate on the first day. You may have a very small amount of blood in the urine. Expect to have a small amount of vaginal discharge/light bleeding for 1-2 weeks. It is not unusual to have abdominal soreness and bruising for up to 2 weeks. You may be tired and need more rest for about 1 week. You may experience shoulder pain for 24-72 hours. Lying flat in bed may relieve it. ? ?Call your doctor for any of the following: ? Develop a fever of 100.4 or greater ? Inability to urinate 6 hours  after discharge from hospital ? Severe pain not relieved by pain medications ? Persistent of heavy bleeding at incision site ? Redness or swelling around incision site after a week ? Increasing nausea or vomiting ? ?No ibuprofen/Motrin/Advil until 5:30pm or after. ?No acetaminophen/Tylenol until 5:30pm or after. ?

## 2021-08-12 NOTE — Anesthesia Procedure Notes (Signed)
Procedure Name: Intubation ?Date/Time: 08/12/2021 2:05 PM ?Performed by: Clearnce Sorrel, CRNA ?Pre-anesthesia Checklist: Patient identified, Emergency Drugs available, Suction available and Patient being monitored ?Patient Re-evaluated:Patient Re-evaluated prior to induction ?Oxygen Delivery Method: Circle System Utilized ?Preoxygenation: Pre-oxygenation with 100% oxygen ?Induction Type: IV induction ?Ventilation: Mask ventilation without difficulty ?Laryngoscope Size: Mac and 4 ?Grade View: Grade I ?Tube type: Oral ?Tube size: 7.5 mm ?Number of attempts: 1 ?Airway Equipment and Method: Stylet and Oral airway ?Placement Confirmation: ETT inserted through vocal cords under direct vision, positive ETCO2 and breath sounds checked- equal and bilateral ?Secured at: 23 cm ?Tube secured with: Tape ?Dental Injury: Teeth and Oropharynx as per pre-operative assessment  ? ? ? ? ?

## 2021-08-12 NOTE — Anesthesia Preprocedure Evaluation (Addendum)
Anesthesia Evaluation  ?Patient identified by MRN, date of birth, ID band ?Patient awake ? ? ? ?Reviewed: ?Allergy & Precautions, NPO status , Patient's Chart, lab work & pertinent test results ? ?History of Anesthesia Complications ?Negative for: history of anesthetic complications ? ?Airway ?Mallampati: III ? ?TM Distance: >3 FB ?Neck ROM: Full ? ? ? Dental ?no notable dental hx. ?(+) Dental Advisory Given ?  ?Pulmonary ?neg pulmonary ROS, former smoker,  ?  ?Pulmonary exam normal ? ? ? ? ? ? ? Cardiovascular ?negative cardio ROS ?Normal cardiovascular exam ? ? ?  ?Neuro/Psych ? Headaches, PSYCHIATRIC DISORDERS Anxiety Depression   ? GI/Hepatic ?negative GI ROS, Neg liver ROS,   ?Endo/Other  ?Morbid obesity ? Renal/GU ?negative Renal ROS  ? ?  ?Musculoskeletal ?negative musculoskeletal ROS ?(+)  ? Abdominal ?  ?Peds ? Hematology ?negative hematology ROS ?(+)   ?Anesthesia Other Findings ? ? Reproductive/Obstetrics ?PCOS ? ?  ? ? ? ? ? ? ? ? ? ? ? ? ? ?  ?  ? ? ? ? ? ? ? ?Anesthesia Physical ?Anesthesia Plan ? ?ASA: 3 ? ?Anesthesia Plan: General  ? ?Post-op Pain Management: Celebrex PO (pre-op)* and Tylenol PO (pre-op)*  ? ?Induction: Intravenous ? ?PONV Risk Score and Plan: 4 or greater and Ondansetron, Dexamethasone, Midazolam and Scopolamine patch - Pre-op ? ?Airway Management Planned: Oral ETT ? ?Additional Equipment:  ? ?Intra-op Plan:  ? ?Post-operative Plan: Extubation in OR ? ?Informed Consent: I have reviewed the patients History and Physical, chart, labs and discussed the procedure including the risks, benefits and alternatives for the proposed anesthesia with the patient or authorized representative who has indicated his/her understanding and acceptance.  ? ? ? ?Dental advisory given ? ?Plan Discussed with: Anesthesiologist and CRNA ? ?Anesthesia Plan Comments:   ? ? ? ? ? ?Anesthesia Quick Evaluation ? ?

## 2021-08-12 NOTE — Anesthesia Postprocedure Evaluation (Signed)
Anesthesia Post Note ? ?Patient: [name] ? ?Procedure(s) Performed: LAPAROSCOPIC GELPORT ASSISTED MYOMECTOMY AND CHROMOPERTUBATION, LYSIS OF ADHESIONS (Abdomen) ? ?  ? ?Patient location during evaluation: PACU ?Anesthesia Type: General ?Level of consciousness: awake and alert ?Pain management: pain level controlled ?Vital Signs Assessment: post-procedure vital signs reviewed and stable ?Respiratory status: spontaneous breathing, nonlabored ventilation, respiratory function stable and patient connected to nasal cannula oxygen ?Cardiovascular status: blood pressure returned to baseline and stable ?Postop Assessment: no apparent nausea or vomiting ?Anesthetic complications: no ? ? ?No notable events documented. ? ?Last Vitals:  ?Vitals:  ? 08/12/21 1700 08/12/21 1800  ?BP: 124/61 116/64  ?Pulse: 72 70  ?Resp: 11 16  ?Temp: 36.8 ?C 36.9 ?C  ?SpO2: 95% 99%  ?  ?Last Pain:  ?Vitals:  ? 08/12/21 1800  ?TempSrc:   ?PainSc: 5   ? ? ?  ?  ?  ?  ?  ?  ? ?[name] ? ? ? ? ?

## 2021-08-12 NOTE — Op Note (Signed)
Operative Note ?  ?Preoperative diagnosis: Uterine fibroids, menorrhagia, infertility ?  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  ?Procedure: Laparoscopy, GelPort assisted myomectomy, lysis of adhesions, fulguration of endometriotic lesions, chromotubation ? ?Surgeon: Governor Specking, MD ? ?Assistant: Sullivan Lone, RNFA ? ?Anesthesia: Gen. endotracheal ?  ?Complications: None ?  ?Estimated blood loss: 50 mL ?  ?Specimens: Uterine myomas to pathology ?  ?Findings: On examination under anesthesia, external genitalia, Bartholin's, Skene's, and urethra were normal. The vagina was normal. The cervix was nulliparous and appeared grossly normal. The uterus was 9 gestational week size, firm and mobile with irregularities caused by myomas. It sounded to 10 cm.  ?On laparoscopy, upper abdomen, liver surface and diaphragm surfaces were normal. Gallbladder was normal. The appendix was not visualized.  The uterus contained 2 cm fundal intramural myoma as well as a 2 x 3 cm fundal pedunculated myoma.  There was a 6 x 5 cm subserosal/left broad ligament multilobular myoma tightly attached to the surrounding structures.  This was also carefully dissected out and removed.  There were also small less than 2 cm posterior subserosal myomas which were not removed. ?The left tube appeared normal with 5 out of 5 fimbria.  It was patent to chromotubation.  The left ovary had 5% adhesion to the ovarian fossa with a dense adhesion.  There were several nonpigmented stellate fibrotic lesions in the left ovarian fossa which were fulgurated.  A needle electrode with 36 W of cutting current was used following hydrodissection.  The right tube was status post salpingectomy and only the proximal 2 cm segment was present.  The right ovary appeared normal.  The right tube appeared normal.  ?  ?Description of the procedure: The patient was  placed in dorsal supine position and general endotracheal anesthesia was given. 3 g of cefazolin were given intravenously for prophylaxis. Patient was placed in lithotomy position. She was prepped and draped in sterile manner.  A Foley catheter was inserted into the bladder.  A ZUMI catheter was placed into the uterine cavity. This was connected to a syringe containing diluted methylene blue solution which was used to define the endometrium during the myomectomy. The uterus sounded to 10 cm The surgeon was regloved and a surgical field was created on the abdomen. ?  ?After preemptive anesthesia of all surgical sites with 0.5% bupivacaine, a 5 mm intraumbilical skin incision was made and a Verress needle was inserted. Its correct location was confirmed. A pneumoperitoneum was created with carbon dioxide.  5 mm laparoscope with a 30? lens was inserted and video laparoscopy was started . A left lower quadrant 5 mm incision was made and ancillary trochars were placed under direct visualization. Above findings were noted. ?  ?A dilute solution of vasopressin (0.4 units per mL) was injected into the myometrium overlying the fundal myoma, until the myometrium blanched. A needle electrode with 52 W of cutting current  was used to make a transverse incision on the myometrium overlying the fundal 6 cm myoma. The myoma was grasped with tenaculum and dissection was started.  ?We then made a 4 cm transverse suprapubic incision at the previous Pfannenstiel scar to insert a GelPort. After dissection of the anatomic layers, the peritoneal cavity was entered. A GelPort was placed and the rest of the case was performed either using this port as a laparoscopic port or as a minilaparotomy port.  ?The pedunculated myoma was also removed.  Next  a longitudinal incision was made on the anterior aspect of the left lower broad ligament and the adherent multilobular 6 x 5 cm myoma was carefully dissected out.  Some in situ morcellation also  had to be carried out and the myoma excised.  In addition a 3 cm posterior longitudinal incision was also made to look for additional myomas but none was encountered although the palpation had suggested so.   ?The myoma defects were closed in 3 layers: The first layer was a deep myometrial suture of 2-0 Vicryl continuous interlocking suture, the second layer was a superficial myometrial layer of 2-0 Vicryl continuous suture. A 3-0 Vicryl continuous suture was placed on the serosa and the most superficial myometrium.  ?The posterior myometrial incision was also closed in a similar fashion.  Next we used 4 W of cutting current on the needle tip electrode and the adhesions advancing the mesentery of the sigmoid and the epiploic appendices to the left infundibulopelvic ligament and left mesosalpinx were lysed.  We used the same electrode to fulgurate the left ovarian fossa adhesions after hydrodissection and we also released the adhesion between the left ovary and the ovarian fossa. ?The suprapubic fascial incision was closed with 0 Vicryl continuous suture. Subcutaneous tissue was irrigated and aspirated good hemostasis was achieved. ?The abdomen and the pelvis was carefully inspected under laparoscopic visualization and the pelvis was copiously irrigated and aspirated. A slurry of 2 sheets of Seprafilm in 60 mL of normal saline was injected as an adhesion barrier into the pelvis. ?The gas was allowed to escape. The instrument and the lap pad count were correct. The trochars were removed.  The subcutaneous Scarpa's fascia of the 4 cm transverse incision was approximated with 2 interrupted sutures of 2-0 catgut.  The skin incisions were approximated with 4-0 Monocryl in subcuticular sutures, including the 4 cm skin incision that belonged to the [place] site.  Dermabond was applied to the skin ?  ?The patient tolerated the procedure well and was transferred to recovery room in satisfactory condition. ?  ?SPECIAL NOTE:  Because of the extent of the myometrial incision during the uterine myomectomy, it is recommended that this patient deliver by a cesarean section with her future pregnancies. ?  ?Governor Specking, MD ?   ?

## 2021-08-12 NOTE — Transfer of Care (Signed)
Immediate Anesthesia Transfer of Care Note ? ?Patient: Carrie Blanchard ? ?Procedure(s) Performed: LAPAROSCOPIC GELPORT ASSISTED MYOMECTOMY AND CHROMOPERTUBATION, LYSIS OF ADHESIONS (Abdomen) ? ?Patient Location: PACU ? ?Anesthesia Type:General ? ?Level of Consciousness: awake, alert  and oriented ? ?Airway & Oxygen Therapy: Patient Spontanous Breathing ? ?Post-op Assessment: Report given to RN and Post -op Vital signs reviewed and stable ? ?Post vital signs: Reviewed and stable ? ?Last Vitals:  ?Vitals Value Taken Time  ?BP 135/58 08/12/21 1623  ?Temp    ?Pulse 74 08/12/21 1626  ?Resp 12 08/12/21 1626  ?SpO2 97 % 08/12/21 1626  ?Vitals shown include unvalidated device data. ? ?Last Pain:  ?Vitals:  ? 08/12/21 1056  ?TempSrc: Oral  ?PainSc: 0-No pain  ?   ? ?Patients Stated Pain Goal: 5 (08/12/21 1056) ? ?Complications: No notable events documented. ?

## 2021-08-13 ENCOUNTER — Encounter (HOSPITAL_BASED_OUTPATIENT_CLINIC_OR_DEPARTMENT_OTHER): Payer: Self-pay | Admitting: Obstetrics and Gynecology

## 2021-08-14 LAB — SURGICAL PATHOLOGY

## 2022-02-12 LAB — OB RESULTS CONSOLE ABO/RH: RH Type: POSITIVE

## 2022-02-12 LAB — OB RESULTS CONSOLE ANTIBODY SCREEN: Antibody Screen: NEGATIVE

## 2022-02-12 LAB — HEPATITIS C ANTIBODY: HCV Ab: NEGATIVE

## 2022-02-12 LAB — OB RESULTS CONSOLE GC/CHLAMYDIA: Chlamydia: NEGATIVE

## 2022-02-12 LAB — OB RESULTS CONSOLE HEPATITIS B SURFACE ANTIGEN: Hepatitis B Surface Ag: NEGATIVE

## 2022-02-12 LAB — OB RESULTS CONSOLE HIV ANTIBODY (ROUTINE TESTING): HIV: NONREACTIVE

## 2022-02-12 LAB — OB RESULTS CONSOLE RUBELLA ANTIBODY, IGM: Rubella: IMMUNE

## 2022-02-12 LAB — OB RESULTS CONSOLE RPR: RPR: NONREACTIVE

## 2022-03-31 NOTE — Progress Notes (Signed)
This encounter was created in error - please disregard.

## 2022-04-07 ENCOUNTER — Encounter: Payer: BC Managed Care – PPO | Attending: Obstetrics and Gynecology | Admitting: Registered"

## 2022-04-07 ENCOUNTER — Encounter: Payer: Self-pay | Admitting: Registered"

## 2022-04-07 DIAGNOSIS — O24419 Gestational diabetes mellitus in pregnancy, unspecified control: Secondary | ICD-10-CM

## 2022-04-07 NOTE — Progress Notes (Signed)
The following learning objectives were met by the patient during this course:   States the definition of Gestational Diabetes States why dietary management is important in controlling blood glucose Describes the effects each nutrient has on blood glucose levels Demonstrates ability to create a balanced meal plan Demonstrates carbohydrate counting  States when to check blood glucose levels Demonstrates proper blood glucose monitoring techniques States the effect of stress and exercise on blood glucose levels States the importance of limiting caffeine and abstaining from alcohol and smoking   Blood glucose monitor given: None.    Patient instructed to monitor glucose levels: FBS: 60 - <95; 1 hour: <140; 2 hour: <120   Patient received handouts: Nutrition Diabetes and Pregnancy, including carb counting list Glucose log sheet   Patient will be seen for follow-up as needed.

## 2022-05-20 ENCOUNTER — Telehealth: Payer: Self-pay | Admitting: Obstetrics and Gynecology

## 2022-05-20 ENCOUNTER — Other Ambulatory Visit: Payer: Self-pay | Admitting: Obstetrics and Gynecology

## 2022-05-20 DIAGNOSIS — Z8249 Family history of ischemic heart disease and other diseases of the circulatory system: Secondary | ICD-10-CM

## 2022-06-16 ENCOUNTER — Encounter: Payer: Self-pay | Admitting: *Deleted

## 2022-06-21 ENCOUNTER — Ambulatory Visit: Payer: BC Managed Care – PPO | Attending: Obstetrics and Gynecology

## 2022-06-21 ENCOUNTER — Encounter: Payer: Self-pay | Admitting: *Deleted

## 2022-06-21 ENCOUNTER — Ambulatory Visit (HOSPITAL_BASED_OUTPATIENT_CLINIC_OR_DEPARTMENT_OTHER): Payer: BC Managed Care – PPO | Admitting: Maternal & Fetal Medicine

## 2022-06-21 ENCOUNTER — Ambulatory Visit: Payer: BC Managed Care – PPO | Admitting: *Deleted

## 2022-06-21 VITALS — BP 123/63 | HR 92

## 2022-06-21 DIAGNOSIS — O09523 Supervision of elderly multigravida, third trimester: Secondary | ICD-10-CM | POA: Diagnosis not present

## 2022-06-21 DIAGNOSIS — Z8249 Family history of ischemic heart disease and other diseases of the circulatory system: Secondary | ICD-10-CM | POA: Diagnosis not present

## 2022-06-21 DIAGNOSIS — O3429 Maternal care due to uterine scar from other previous surgery: Secondary | ICD-10-CM

## 2022-06-21 DIAGNOSIS — Z3A29 29 weeks gestation of pregnancy: Secondary | ICD-10-CM | POA: Diagnosis not present

## 2022-06-21 DIAGNOSIS — Z9889 Other specified postprocedural states: Secondary | ICD-10-CM | POA: Insufficient documentation

## 2022-06-21 DIAGNOSIS — O2623 Pregnancy care for patient with recurrent pregnancy loss, third trimester: Secondary | ICD-10-CM

## 2022-06-21 DIAGNOSIS — Z8279 Family history of other congenital malformations, deformations and chromosomal abnormalities: Secondary | ICD-10-CM | POA: Diagnosis not present

## 2022-06-21 DIAGNOSIS — O99283 Endocrine, nutritional and metabolic diseases complicating pregnancy, third trimester: Secondary | ICD-10-CM

## 2022-06-21 DIAGNOSIS — O09813 Supervision of pregnancy resulting from assisted reproductive technology, third trimester: Secondary | ICD-10-CM | POA: Diagnosis not present

## 2022-06-21 DIAGNOSIS — O24414 Gestational diabetes mellitus in pregnancy, insulin controlled: Secondary | ICD-10-CM

## 2022-06-21 DIAGNOSIS — E079 Disorder of thyroid, unspecified: Secondary | ICD-10-CM

## 2022-06-21 DIAGNOSIS — E669 Obesity, unspecified: Secondary | ICD-10-CM

## 2022-06-21 DIAGNOSIS — D259 Leiomyoma of uterus, unspecified: Secondary | ICD-10-CM

## 2022-06-21 DIAGNOSIS — O99213 Obesity complicating pregnancy, third trimester: Secondary | ICD-10-CM

## 2022-06-21 NOTE — Progress Notes (Signed)
Patient information  Patient Name: Carrie Blanchard  Patient MRN:   [id number]  Referring practice: [facility] Referring Provider: Dr. Marvel Plan  Medical/Obstetric History   Past pregnancies OB History  Gravida Para Term Preterm AB Living  5       4    SAB IAB Ectopic Multiple Live Births  4            # Outcome Date GA Lbr Len/2nd Weight Sex Delivery Anes PTL Lv  5 Current           4 SAB           3 SAB           2 SAB           1 SAB              Carrie Blanchard is a 42 y.o. [id number] at [redacted]w[redacted]d here for ultrasound and consultation.   RE diabetes in pregnancy: The patient has PCOS outside of pregnancy and was taking metformin prior to pregnancy.  Around 16 weeks she was started on insulin due to glucose intolerance.  Currently she is on NPH 18 units in the morning and 28 units at night.  She reports difficulty with fasting hyperglycemia but the remainder of her blood sugars are usually well-controlled at least 50% of the time.  We discussed the various complications of uncontrolled diabetes in pregnancy.  The patient is already going to be scheduled for cesarean delivery at 37 weeks due to history of myomectomy therefore delivery timing does not need to be adjusted unless glucose become very abnormal and difficult to control.  Currently the estimated fetal weight is in the 90th percentile.  We discussed the need for antenatal testing and serial growth ultrasounds due to the increased risk of stillbirth.  We also discussed the post birth neonatal complications of uncontrolled diabetes in pregnancy.  I recommend she have a high-protein snack at night to improve fasting hyperglycemia.  She can also check her 3 AM blood sugars if the fasting blood sugar does not improve despite increases in insulin and dietary adjustment.  If her 3 AM blood sugars are low (<60) then she needs to have less insulin at night.  If they are normal or high then she needs to continue to go up on her insulin at night.  RE  family history of congential heart disease on father's side: the father of the baby has a family history of IHSS (multiple family members on his side, but not himself).  There are other family members also have this condition.  Carrie Blanchard does not have a history of heart problems in the family that are genetic.  The fetus already had a fetal echo at [facility].  There is no evidence of congenital heart disease on today's ultrasound although the heart views are limited given the advanced gestation.  I discussed that IHSS is an autosomal dominant condition that can be genetically transmitted only if one parent is affected unless there was a spontaneous mutation in the family.  We discussed meeting with a genetic counselor.  The patient verbalized understanding and wants to think about this and have her referring provider to arrange this in the future if she decides to go forward.  The alternative option would be to notify the pediatricians at birth regarding the family history with the appropriate monitoring after delivery.  The remainder the patient's medical conditions have been addressed by the referring provider.  The patient has  no further questions at this time.  Review of Systems: A review of systems was performed and was negative except per HPI   Vitals and Physical Exam    06/21/2022    7:21 AM 08/12/2021    6:00 PM 08/12/2021    5:00 PM  Vitals with BMI  Systolic [id number]  Diastolic 63 64 61  Pulse 92 70 72  Sitting comfortably on the sonogram table Nonlabored breathing Normal rate and rhythm Abdomen is nontender  Sonographic findings Single intrauterine pregnancy. Fetal cardiac activity:  Observed and appears normal. Presentation: Breech. The anatomic structures that were well seen appear normal without evidence of soft markers. Due to poor acoustic windows, the visualization some structures remain suboptimally seen. Fetal biometry shows the estimated fetal weight at the 91  percentile Amniotic fluid volume: Within normal limits. MVP: 5.19 cm. Placenta: Posterior.  Assessment 1. Early onset gestation diabets 2. Family history of IHSS 3. History of myomectomy 4. IVF preganncy 5. Obesity Plan -Detailed ultrasound was done today without abnormalities -Increase NPH by 2U every night her fasting blood sugars are >95 -Check 3am blood sugar 2-3 times, as long as it is >60 then she can increase her PM insulin. If the 3 am blood sugar is normal she does not need to keep checking it. -Encouraged low-cab/sugar diet with a high protein snack at night -Baseline preeclampsia labs: CMP, CBC and urine protein/creatinine ratio have been completed by the referring provider -Continue Aspirin 81 mg for preeclampsia prophylasis -Serial growth ultrasounds every 4-5 weeks to monitor for fetal growth restriction -Fetal echo has been completed at [facility] -It is unlikely the fetus is affected with IHSS since the father has been tested and is negative and the pattern is autosomal dominant.  However there are various nuances of this condition and prenatal genetic counseling is recommended. The patient is thinking about if she wants prenatal genetic counseling.  She will let her OB provider know.  Please refer back to MFM genetic counseling if desired. -Antenatal testing to start around 32 weeks due to the increased risk of stillbirth and high risk pregnancy -Delivery: Plan for cesarean delivery at 37 weeks given her history of myomectomy or sooner if indicated -Continue routine prenatal care with referring OB provider  I spent 60 minutes reviewing the patients chart, including labs and images as well as counseling the patient about her medical conditions. Greater than 50% of the time was spent in direct patient counseling.  Valeda Malm  MFM,    06/21/2022  9:00 AM   Future Appointments   No future appointments.

## 2022-08-04 ENCOUNTER — Telehealth (HOSPITAL_COMMUNITY): Payer: Self-pay | Admitting: *Deleted

## 2022-08-04 ENCOUNTER — Encounter (HOSPITAL_COMMUNITY): Payer: Self-pay

## 2022-08-04 NOTE — Patient Instructions (Addendum)
Carrie Blanchard  08/04/2022   Your procedure is scheduled on:  08/18/2022  Arrive at 0630 at [employer] [facility] on [address] at [facility]  and [employer]. You are invited to use the FREE valet parking or use the Visitor's parking deck.  Pick up the phone at the desk and dial [phone].  Call this number if you have problems the morning of surgery: [phone]  Remember:   Do not eat food:(After Midnight) Desps de medianoche.  Do not drink clear liquids: (After Midnight) Desps de medianoche.  Take these medicines the morning of surgery with A SIP OF WATER:  Take buspar, levothyroxine and wellbutrin as prescribed.  Take half of your prescribed insulin dose the night before surgery and no insulin on day of surgery.   Do not wear jewelry, make-up or nail polish.  Do not wear lotions, powders, or perfumes. Do not wear deodorant.  Do not shave 48 hours prior to surgery.  Do not bring valuables to the hospital.  [facility] is not   responsible for any belongings or valuables brought to the hospital.  Contacts, dentures or bridgework may not be worn into surgery.  Leave suitcase in the car. After surgery it may be brought to your room.  For patients admitted to the hospital, checkout time is 11:00 AM the day of              discharge.      Please read over the following fact sheets that you were given:     Preparing for Surgery

## 2022-08-04 NOTE — Telephone Encounter (Signed)
Preadmission screen  

## 2022-08-05 ENCOUNTER — Encounter (HOSPITAL_COMMUNITY): Payer: Self-pay

## 2022-08-16 ENCOUNTER — Encounter (HOSPITAL_COMMUNITY)
Admission: RE | Admit: 2022-08-16 | Discharge: 2022-08-16 | Disposition: A | Discharge status: Home / Self Care | Payer: BC Managed Care – PPO | Source: Ambulatory Visit | Attending: Obstetrics and Gynecology | Admitting: Obstetrics and Gynecology

## 2022-08-16 DIAGNOSIS — Z01812 Encounter for preprocedural laboratory examination: Secondary | ICD-10-CM | POA: Insufficient documentation

## 2022-08-16 DIAGNOSIS — O3429 Maternal care due to uterine scar from other previous surgery: Secondary | ICD-10-CM | POA: Insufficient documentation

## 2022-08-16 HISTORY — DX: Gestational diabetes mellitus in pregnancy, unspecified control: O24.419

## 2022-08-16 HISTORY — DX: Hypothyroidism, unspecified: E03.9

## 2022-08-16 LAB — COMPREHENSIVE METABOLIC PANEL
ALT: 14 U/L (ref 0–44)
AST: 19 U/L (ref 15–41)
Albumin: 2.5 g/dL — ABNORMAL LOW (ref 3.5–5.0)
Alkaline Phosphatase: 143 U/L — ABNORMAL HIGH (ref 38–126)
Anion gap: 13 (ref 5–15)
BUN: 9 mg/dL (ref 6–20)
CO2: 20 mmol/L — ABNORMAL LOW (ref 22–32)
Calcium: 8.4 mg/dL — ABNORMAL LOW (ref 8.9–10.3)
Chloride: 103 mmol/L (ref 98–111)
Creatinine, Ser: 0.6 mg/dL (ref 0.44–1.00)
GFR, Estimated: >60 mL/min (ref >60)
Glucose, Bld: 99 mg/dL (ref 70–99)
Potassium: 3.7 mmol/L (ref 3.5–5.1)
Sodium: 136 mmol/L (ref 135–145)
Total Bilirubin: 0.3 mg/dL (ref 0.3–1.2)
Total Protein: 6 g/dL — ABNORMAL LOW (ref 6.5–8.1)

## 2022-08-16 LAB — CBC
HCT: 38.7 % (ref 36.0–46.0)
Hemoglobin: 12.9 g/dL (ref 12.0–15.0)
MCH: 30.1 pg (ref 26.0–34.0)
MCHC: 33.3 g/dL (ref 30.0–36.0)
MCV: 90.2 fL (ref 80.0–100.0)
Platelets: 291 10*3/uL (ref 150–400)
RBC: 4.29 MIL/uL (ref 3.87–5.11)
RDW: 13.5 % (ref 11.5–15.5)
WBC: 8.1 10*3/uL (ref 4.0–10.5)
nRBC: 0 % (ref 0.0–0.2)

## 2022-08-16 LAB — RPR
RPR Ser Ql: REACTIVE — AB
RPR Titer: 1:1 {titer}

## 2022-08-16 LAB — TYPE AND SCREEN
ABO/RH(D): O POS
Antibody Screen: NEGATIVE

## 2022-08-17 LAB — T.PALLIDUM AB, TOTAL: T Pallidum Abs: NONREACTIVE

## 2022-08-17 NOTE — H&P (Signed)
Carrie Blanchard is a 42 y.o. female, G4 P0030, EGA 37+ weeks with EDC 6-1 presenting for scheduled c-section for previous myomectomy.  PNC also complicated by A2GDM, controlled with Metformin 1000mg  bid and NPH insulin 18 units q am, 28 units q pm.  She has mixed anxiety and depression controlled with Wellbutrin and Buspar.  Also IVF pregnancy and obesity.  OB History     Gravida  5   Para      Term      Preterm      AB  4   Living         SAB  4   IAB      Ectopic      Multiple      Live Births             Past Medical History:  Diagnosis Date   Chronic tension headaches    Frequency of urination    GAD (generalized anxiety disorder)    Gestational diabetes    History of COVID-19 2022   per pt mild to moderate symptoms that resolved   History of palpitations in adulthood 01/11/2012   Hypothyroidism    Infertility, female    MDD (major depressive disorder)    Polycystic ovarian syndrome    Uterine myoma    Wears glasses    Past Surgical History:  Procedure Laterality Date   LAPAROSCOPIC GELPORT ASSISTED MYOMECTOMY N/A 08/12/2021   Procedure: LAPAROSCOPIC GELPORT ASSISTED MYOMECTOMY AND CHROMOPERTUBATION, LYSIS OF ADHESIONS;  Surgeon: Fermin Schwab, MD;  Location: [facility];  Service: Gynecology;  Laterality: N/A;   OVARIAN CYST REMOVAL     age 89;   Large  (Grapefruit size per pt)   OVUM / OOCYTE RETRIEVAL  06/2021   Family History: family history includes Arthritis in her mother; Cancer in her maternal grandmother; Diabetes in her father. Social History:  reports that she quit smoking about 22 months ago. Her smoking use included cigarettes. She has a 7.50 pack-year smoking history. She has never used smokeless tobacco. She reports that she does not currently use alcohol. She reports that she does not use drugs.     Maternal Diabetes: Yes:  Diabetes Type:  Pre-pregnancy, Insulin/Medication controlled Genetic Screening:  Declined Maternal Ultrasounds/Referrals: Normal Fetal Ultrasounds or other Referrals:  None Maternal Substance Abuse:  No Significant Maternal Medications:  Meds include: Other:  Significant Maternal Lab Results:  Group B Strep negative Number of Prenatal Visits:greater than 3 verified prenatal visits Other Comments:   Metformin, NPH insulin, baby ASA  Review of Systems  Respiratory: Negative.    Cardiovascular: Negative.    Maternal Medical History:  Fetal activity: Perceived fetal activity is normal.   Prenatal Complications - Diabetes: gestational. Diabetes is managed by oral agent (monotherapy).       unknown if currently breastfeeding. Maternal Exam:  Abdomen: Patient reports no abdominal tenderness. Surgical scars: low transverse.   Introitus: Normal vulva. Normal vagina.  Amniotic fluid character: not assessed.   Physical Exam Constitutional:      Appearance: She is obese.  Cardiovascular:     Rate and Rhythm: Normal rate and regular rhythm.     Heart sounds: Normal heart sounds. No murmur heard. Pulmonary:     Effort: Pulmonary effort is normal. No respiratory distress.     Breath sounds: Normal breath sounds. No wheezing.  Abdominal:     Palpations: Abdomen is soft.  Genitourinary:    General: Normal vulva.  Musculoskeletal:  Cervical back: Normal range of motion and neck supple.  Neurological:     Mental Status: She is alert.     Prenatal labs: ABO, Rh: --/--/O POS (05/13 0454) Antibody: NEG (05/13 0981) Rubella: Immune (11/10 0000) RPR: Reactive (05/13 1000)  HBsAg: Negative (11/10 0000)  HIV: Non-reactive (11/10 0000)  GBS:   neg  Assessment/Plan: IUP at 37+ weeks, previous myomectomy, A2GDM, IVF pregnancy, admitted for primary c-section.  C-section procedure and risks discussed, questions answered.  Will admit for scheduled c-section   Zenaida Niece 08/17/2022, 8:18 AM

## 2022-08-17 NOTE — Anesthesia Preprocedure Evaluation (Signed)
Anesthesia Evaluation  Patient identified by MRN, date of birth, ID band Patient awake    Reviewed: Allergy & Precautions, NPO status , Patient's Chart, lab work & pertinent test results  History of Anesthesia Complications Negative for: history of anesthetic complications  Airway Mallampati: III  TM Distance: >3 FB Neck ROM: Full    Dental  (+) Dental Advisory Given, Teeth Intact   Pulmonary former smoker   Pulmonary exam normal        Cardiovascular negative cardio ROS Normal cardiovascular exam     Neuro/Psych  Headaches PSYCHIATRIC DISORDERS Anxiety Depression       GI/Hepatic negative GI ROS, Neg liver ROS,,,  Endo/Other  diabetes, Gestational, Oral Hypoglycemic Agents, Insulin DependentHypothyroidism  Morbid obesity  Renal/GU negative Renal ROS     Musculoskeletal negative musculoskeletal ROS (+)    Abdominal  (+) + obese  Peds  Hematology negative hematology ROS (+)   Anesthesia Other Findings   Reproductive/Obstetrics (+) Pregnancy  PCOS                              Anesthesia Physical Anesthesia Plan  ASA: 3  Anesthesia Plan: Spinal   Post-op Pain Management: Tylenol PO (pre-op)*   Induction:   PONV Risk Score and Plan: 2 and Treatment may vary due to age or medical condition, Ondansetron and Scopolamine patch - Pre-op  Airway Management Planned: Natural Airway  Additional Equipment: None  Intra-op Plan:   Post-operative Plan:   Informed Consent: I have reviewed the patients History and Physical, chart, labs and discussed the procedure including the risks, benefits and alternatives for the proposed anesthesia with the patient or authorized representative who has indicated his/her understanding and acceptance.       Plan Discussed with: CRNA and Anesthesiologist  Anesthesia Plan Comments: (Labs reviewed, platelets acceptable. Discussed risks and benefits of  spinal, including spinal/epidural hematoma, infection, failed block, and PDPH. Patient expressed understanding and wished to proceed. )        Anesthesia Quick Evaluation

## 2022-08-18 ENCOUNTER — Encounter (HOSPITAL_COMMUNITY): Payer: Self-pay | Admitting: Obstetrics and Gynecology

## 2022-08-18 ENCOUNTER — Inpatient Hospital Stay (HOSPITAL_COMMUNITY): Payer: BC Managed Care – PPO | Admitting: Anesthesiology

## 2022-08-18 ENCOUNTER — Inpatient Hospital Stay (HOSPITAL_COMMUNITY)
Admission: AD | Admit: 2022-08-18 | Discharge: 2022-08-21 | DRG: 788 | Disposition: A | Discharge status: Home / Self Care | Payer: BC Managed Care – PPO | Attending: Obstetrics and Gynecology | Admitting: Obstetrics and Gynecology

## 2022-08-18 ENCOUNTER — Other Ambulatory Visit: Payer: Self-pay

## 2022-08-18 ENCOUNTER — Encounter (HOSPITAL_COMMUNITY)
Admission: AD | Disposition: A | Discharge status: Home / Self Care | Payer: Self-pay | Source: Home / Self Care | Attending: Obstetrics and Gynecology

## 2022-08-18 DIAGNOSIS — F419 Anxiety disorder, unspecified: Secondary | ICD-10-CM | POA: Diagnosis present

## 2022-08-18 DIAGNOSIS — O321XX0 Maternal care for breech presentation, not applicable or unspecified: Secondary | ICD-10-CM | POA: Diagnosis present

## 2022-08-18 DIAGNOSIS — O99214 Obesity complicating childbirth: Secondary | ICD-10-CM | POA: Diagnosis present

## 2022-08-18 DIAGNOSIS — F32A Depression, unspecified: Secondary | ICD-10-CM | POA: Diagnosis present

## 2022-08-18 DIAGNOSIS — O99344 Other mental disorders complicating childbirth: Secondary | ICD-10-CM | POA: Diagnosis present

## 2022-08-18 DIAGNOSIS — Z87891 Personal history of nicotine dependence: Secondary | ICD-10-CM

## 2022-08-18 DIAGNOSIS — O3429 Maternal care due to uterine scar from other previous surgery: Principal | ICD-10-CM | POA: Diagnosis present

## 2022-08-18 DIAGNOSIS — E039 Hypothyroidism, unspecified: Secondary | ICD-10-CM | POA: Diagnosis present

## 2022-08-18 DIAGNOSIS — O24419 Gestational diabetes mellitus in pregnancy, unspecified control: Secondary | ICD-10-CM

## 2022-08-18 DIAGNOSIS — Z3A37 37 weeks gestation of pregnancy: Secondary | ICD-10-CM | POA: Diagnosis not present

## 2022-08-18 DIAGNOSIS — Z98891 History of uterine scar from previous surgery: Secondary | ICD-10-CM

## 2022-08-18 DIAGNOSIS — O24424 Gestational diabetes mellitus in childbirth, insulin controlled: Secondary | ICD-10-CM | POA: Diagnosis present

## 2022-08-18 DIAGNOSIS — Z8616 Personal history of COVID-19: Secondary | ICD-10-CM | POA: Diagnosis not present

## 2022-08-18 LAB — GLUCOSE, CAPILLARY
Glucose-Capillary: 70 mg/dL (ref 70–99)
Glucose-Capillary: 75 mg/dL (ref 70–99)
Glucose-Capillary: 121 mg/dL — ABNORMAL HIGH (ref 70–99)
Glucose-Capillary: 163 mg/dL — ABNORMAL HIGH (ref 70–99)

## 2022-08-18 SURGERY — Surgical Case
Anesthesia: Spinal | Site: Abdomen

## 2022-08-18 MED ORDER — PRENATAL MULTIVITAMIN CH
1.0000 | ORAL_TABLET | Freq: Every day | ORAL | Status: DC
  Administered 2022-08-18 – 2022-08-21 (×4): 1 via ORAL
  Filled 2022-08-18 (×4): qty 1

## 2022-08-18 MED ORDER — BUSPIRONE HCL 5 MG PO TABS
5.0000 mg | ORAL_TABLET | Freq: Two times a day (BID) | ORAL | Status: DC
  Administered 2022-08-18 – 2022-08-21 (×6): 5 mg via ORAL
  Filled 2022-08-18 (×7): qty 1

## 2022-08-18 MED ORDER — METHYLERGONOVINE MALEATE 0.2 MG PO TABS: 0.2000 mg | ORAL_TABLET | ORAL | Status: DC | PRN

## 2022-08-18 MED ORDER — CEFAZOLIN IN SODIUM CHLORIDE 3-0.9 GM/100ML-% IV SOLN
3.0000 g | INTRAVENOUS | Status: AC
  Administered 2022-08-18: 3 g via INTRAVENOUS

## 2022-08-18 MED ORDER — OXYTOCIN-SODIUM CHLORIDE 30-0.9 UT/500ML-% IV SOLN
INTRAVENOUS | Status: DC | PRN
  Administered 2022-08-18: 400 mL via INTRAVENOUS

## 2022-08-18 MED ORDER — FENTANYL CITRATE (PF) 100 MCG/2ML IJ SOLN
INTRAMUSCULAR | Status: DC | PRN
  Administered 2022-08-18: 15 ug via INTRATHECAL

## 2022-08-18 MED ORDER — ONDANSETRON HCL 4 MG/2ML IJ SOLN
INTRAMUSCULAR | Status: AC
  Filled 2022-08-18: qty 2

## 2022-08-18 MED ORDER — KETOROLAC TROMETHAMINE 30 MG/ML IJ SOLN
INTRAMUSCULAR | Status: AC
  Filled 2022-08-18: qty 1

## 2022-08-18 MED ORDER — CHLORHEXIDINE GLUCONATE 0.12 % MT SOLN
OROMUCOSAL | Status: AC
  Filled 2022-08-18: qty 15

## 2022-08-18 MED ORDER — KETOROLAC TROMETHAMINE 30 MG/ML IJ SOLN: 30.0000 mg | Freq: Four times a day (QID) | INTRAMUSCULAR | Status: AC | PRN

## 2022-08-18 MED ORDER — LEVOTHYROXINE SODIUM 50 MCG PO TABS
25.0000 ug | ORAL_TABLET | Freq: Every day | ORAL | Status: DC
  Administered 2022-08-19: 25 ug via ORAL
  Filled 2022-08-18 (×2): qty 1

## 2022-08-18 MED ORDER — WITCH HAZEL-GLYCERIN EX PADS: 1.0000 | MEDICATED_PAD | CUTANEOUS | Status: DC | PRN

## 2022-08-18 MED ORDER — BUPIVACAINE IN DEXTROSE 0.75-8.25 % IT SOLN
INTRATHECAL | Status: DC | PRN
  Administered 2022-08-18: 1.6 mL via INTRATHECAL

## 2022-08-18 MED ORDER — OXYCODONE HCL 5 MG PO TABS
5.0000 mg | ORAL_TABLET | ORAL | Status: DC | PRN
  Administered 2022-08-19: 5 mg via ORAL
  Administered 2022-08-20 (×2): 10 mg via ORAL
  Filled 2022-08-18 (×2): qty 2
  Filled 2022-08-18: qty 1

## 2022-08-18 MED ORDER — STERILE WATER FOR IRRIGATION IR SOLN
Status: DC | PRN
  Administered 2022-08-18: 1000 mL

## 2022-08-18 MED ORDER — NALOXONE HCL 0.4 MG/ML IJ SOLN: 0.4000 mg | INTRAMUSCULAR | Status: DC | PRN

## 2022-08-18 MED ORDER — ACETAMINOPHEN 500 MG PO TABS
ORAL_TABLET | ORAL | Status: AC
  Filled 2022-08-18: qty 2

## 2022-08-18 MED ORDER — TETANUS-DIPHTH-ACELL PERTUSSIS 5-2.5-18.5 LF-MCG/0.5 IM SUSY: 0.5000 mL | PREFILLED_SYRINGE | Freq: Once | INTRAMUSCULAR | Status: DC

## 2022-08-18 MED ORDER — PROMETHAZINE HCL 25 MG/ML IJ SOLN: 6.2500 mg | INTRAMUSCULAR | Status: DC | PRN

## 2022-08-18 MED ORDER — POVIDONE-IODINE 10 % EX SWAB
2.0000 | Freq: Once | CUTANEOUS | Status: AC
  Administered 2022-08-18: 2 via TOPICAL

## 2022-08-18 MED ORDER — LACTATED RINGERS IV SOLN: INTRAVENOUS | Status: DC

## 2022-08-18 MED ORDER — SOD CITRATE-CITRIC ACID 500-334 MG/5ML PO SOLN
30.0000 mL | Freq: Once | ORAL | Status: AC
  Administered 2022-08-18: 30 mL via ORAL

## 2022-08-18 MED ORDER — FAMOTIDINE 20 MG PO TABS
ORAL_TABLET | ORAL | Status: AC
  Filled 2022-08-18: qty 1

## 2022-08-18 MED ORDER — MORPHINE SULFATE (PF) 0.5 MG/ML IJ SOLN
INTRAMUSCULAR | Status: AC
  Filled 2022-08-18: qty 10

## 2022-08-18 MED ORDER — SODIUM CHLORIDE 0.9 % IR SOLN
Status: DC | PRN
  Administered 2022-08-18: 1000 mL

## 2022-08-18 MED ORDER — DEXAMETHASONE SODIUM PHOSPHATE 4 MG/ML IJ SOLN
INTRAMUSCULAR | Status: AC
  Filled 2022-08-18: qty 2

## 2022-08-18 MED ORDER — KETOROLAC TROMETHAMINE 30 MG/ML IJ SOLN
30.0000 mg | Freq: Four times a day (QID) | INTRAMUSCULAR | Status: AC
  Filled 2022-08-18 (×3): qty 1

## 2022-08-18 MED ORDER — MORPHINE SULFATE (PF) 0.5 MG/ML IJ SOLN
INTRAMUSCULAR | Status: DC | PRN
  Administered 2022-08-18: 150 ug via INTRATHECAL

## 2022-08-18 MED ORDER — MEASLES, MUMPS & RUBELLA VAC IJ SOLR: 0.5000 mL | Freq: Once | INTRAMUSCULAR | Status: DC

## 2022-08-18 MED ORDER — OXYTOCIN-SODIUM CHLORIDE 30-0.9 UT/500ML-% IV SOLN
INTRAVENOUS | Status: AC
  Filled 2022-08-18: qty 500

## 2022-08-18 MED ORDER — FENTANYL CITRATE (PF) 100 MCG/2ML IJ SOLN: 25.0000 ug | INTRAMUSCULAR | Status: DC | PRN

## 2022-08-18 MED ORDER — MEPERIDINE HCL 25 MG/ML IJ SOLN: 6.2500 mg | INTRAMUSCULAR | Status: DC | PRN

## 2022-08-18 MED ORDER — PHENYLEPHRINE 80 MCG/ML (10ML) SYRINGE FOR IV PUSH (FOR BLOOD PRESSURE SUPPORT)
PREFILLED_SYRINGE | INTRAVENOUS | Status: AC
  Filled 2022-08-18: qty 10

## 2022-08-18 MED ORDER — COCONUT OIL OIL
1.0000 | TOPICAL_OIL | Status: DC | PRN
  Administered 2022-08-19: 1 via TOPICAL

## 2022-08-18 MED ORDER — SIMETHICONE 80 MG PO CHEW: 80.0000 mg | CHEWABLE_TABLET | ORAL | Status: DC | PRN

## 2022-08-18 MED ORDER — ONDANSETRON HCL 4 MG/2ML IJ SOLN: 4.0000 mg | Freq: Three times a day (TID) | INTRAMUSCULAR | Status: DC | PRN

## 2022-08-18 MED ORDER — FENTANYL CITRATE (PF) 100 MCG/2ML IJ SOLN
INTRAMUSCULAR | Status: AC
  Filled 2022-08-18: qty 2

## 2022-08-18 MED ORDER — METFORMIN HCL 500 MG PO TABS
1000.0000 mg | ORAL_TABLET | Freq: Two times a day (BID) | ORAL | Status: DC
  Administered 2022-08-18 – 2022-08-21 (×6): 1000 mg via ORAL
  Filled 2022-08-18 (×6): qty 2

## 2022-08-18 MED ORDER — CEFAZOLIN IN SODIUM CHLORIDE 3-0.9 GM/100ML-% IV SOLN
INTRAVENOUS | Status: AC
  Filled 2022-08-18: qty 100

## 2022-08-18 MED ORDER — MENTHOL 3 MG MT LOZG
1.0000 | LOZENGE | OROMUCOSAL | Status: DC | PRN
  Administered 2022-08-19 – 2022-08-20 (×2): 3 mg via ORAL
  Filled 2022-08-18 (×3): qty 9

## 2022-08-18 MED ORDER — FAMOTIDINE 20 MG PO TABS
20.0000 mg | ORAL_TABLET | Freq: Once | ORAL | Status: AC
  Administered 2022-08-18: 20 mg via ORAL

## 2022-08-18 MED ORDER — SCOPOLAMINE 1 MG/3DAYS TD PT72
1.0000 | MEDICATED_PATCH | Freq: Once | TRANSDERMAL | Status: DC
  Administered 2022-08-18: 1.5 mg via TRANSDERMAL

## 2022-08-18 MED ORDER — NALOXONE HCL 4 MG/10ML IJ SOLN: 1.0000 ug/kg/h | INTRAVENOUS | Status: DC | PRN

## 2022-08-18 MED ORDER — CHLORHEXIDINE GLUCONATE 0.12 % MT SOLN
15.0000 mL | Freq: Once | OROMUCOSAL | Status: AC
  Administered 2022-08-18: 15 mL via OROMUCOSAL

## 2022-08-18 MED ORDER — SCOPOLAMINE 1 MG/3DAYS TD PT72
MEDICATED_PATCH | TRANSDERMAL | Status: AC
  Filled 2022-08-18: qty 1

## 2022-08-18 MED ORDER — ONDANSETRON HCL 4 MG/2ML IJ SOLN
INTRAMUSCULAR | Status: DC | PRN
  Administered 2022-08-18: 4 mg via INTRAVENOUS

## 2022-08-18 MED ORDER — ACETAMINOPHEN 500 MG PO TABS
1000.0000 mg | ORAL_TABLET | Freq: Four times a day (QID) | ORAL | Status: DC
  Administered 2022-08-18 – 2022-08-21 (×9): 1000 mg via ORAL
  Filled 2022-08-18 (×9): qty 2

## 2022-08-18 MED ORDER — SODIUM CHLORIDE 0.9% FLUSH: 3.0000 mL | INTRAVENOUS | Status: DC | PRN

## 2022-08-18 MED ORDER — METOCLOPRAMIDE HCL 5 MG/ML IJ SOLN
INTRAMUSCULAR | Status: AC
  Filled 2022-08-18: qty 2

## 2022-08-18 MED ORDER — PHENYLEPHRINE HCL-NACL 20-0.9 MG/250ML-% IV SOLN
INTRAVENOUS | Status: DC | PRN
  Administered 2022-08-18: 60 ug/min via INTRAVENOUS

## 2022-08-18 MED ORDER — ACETAMINOPHEN 500 MG PO TABS
1000.0000 mg | ORAL_TABLET | Freq: Once | ORAL | Status: AC
  Administered 2022-08-18: 1000 mg via ORAL

## 2022-08-18 MED ORDER — PHENYLEPHRINE HCL-NACL 20-0.9 MG/250ML-% IV SOLN
INTRAVENOUS | Status: AC
  Filled 2022-08-18: qty 250

## 2022-08-18 MED ORDER — DIPHENHYDRAMINE HCL 25 MG PO CAPS: 25.0000 mg | ORAL_CAPSULE | Freq: Four times a day (QID) | ORAL | Status: DC | PRN

## 2022-08-18 MED ORDER — OXYTOCIN-SODIUM CHLORIDE 30-0.9 UT/500ML-% IV SOLN
2.5000 [IU]/h | INTRAVENOUS | Status: AC
  Filled 2022-08-18: qty 500

## 2022-08-18 MED ORDER — IBUPROFEN 600 MG PO TABS
600.0000 mg | ORAL_TABLET | Freq: Four times a day (QID) | ORAL | Status: DC
  Administered 2022-08-20 – 2022-08-21 (×7): 600 mg via ORAL
  Filled 2022-08-18 (×10): qty 1

## 2022-08-18 MED ORDER — OXYCODONE HCL 5 MG PO TABS: 5.0000 mg | ORAL_TABLET | Freq: Once | ORAL | Status: DC | PRN

## 2022-08-18 MED ORDER — SOD CITRATE-CITRIC ACID 500-334 MG/5ML PO SOLN
ORAL | Status: AC
  Filled 2022-08-18: qty 30

## 2022-08-18 MED ORDER — PHENYLEPHRINE HCL (PRESSORS) 10 MG/ML IV SOLN
INTRAVENOUS | Status: DC | PRN
  Administered 2022-08-18: 160 ug via INTRAVENOUS

## 2022-08-18 MED ORDER — OXYCODONE HCL 5 MG/5ML PO SOLN: 5.0000 mg | Freq: Once | ORAL | Status: DC | PRN

## 2022-08-18 MED ORDER — METHYLERGONOVINE MALEATE 0.2 MG/ML IJ SOLN: 0.2000 mg | INTRAMUSCULAR | Status: DC | PRN

## 2022-08-18 MED ORDER — DEXAMETHASONE SODIUM PHOSPHATE 10 MG/ML IJ SOLN
INTRAMUSCULAR | Status: DC | PRN
  Administered 2022-08-18: 8 mg via INTRAVENOUS

## 2022-08-18 MED ORDER — DIPHENHYDRAMINE HCL 25 MG PO CAPS: 25.0000 mg | ORAL_CAPSULE | ORAL | Status: DC | PRN

## 2022-08-18 MED ORDER — ORAL CARE MOUTH RINSE: 15.0000 mL | Freq: Once | OROMUCOSAL | Status: AC

## 2022-08-18 MED ORDER — DIBUCAINE (PERIANAL) 1 % EX OINT: 1.0000 | TOPICAL_OINTMENT | CUTANEOUS | Status: DC | PRN

## 2022-08-18 MED ORDER — SCOPOLAMINE 1 MG/3DAYS TD PT72: 1.0000 | MEDICATED_PATCH | Freq: Once | TRANSDERMAL | Status: DC

## 2022-08-18 MED ORDER — ACETAMINOPHEN 160 MG/5ML PO SOLN: 960.0000 mg | Freq: Once | ORAL | Status: AC

## 2022-08-18 MED ORDER — ENOXAPARIN SODIUM 60 MG/0.6ML IJ SOSY
0.5000 mg/kg | PREFILLED_SYRINGE | INTRAMUSCULAR | Status: DC
  Administered 2022-08-19 – 2022-08-21 (×3): 60 mg via SUBCUTANEOUS
  Filled 2022-08-18 (×3): qty 0.6

## 2022-08-18 MED ORDER — SENNOSIDES-DOCUSATE SODIUM 8.6-50 MG PO TABS
2.0000 | ORAL_TABLET | ORAL | Status: DC
  Administered 2022-08-18 – 2022-08-21 (×4): 2 via ORAL
  Filled 2022-08-18 (×4): qty 2

## 2022-08-18 MED ORDER — BUPROPION HCL ER (XL) 150 MG PO TB24
150.0000 mg | ORAL_TABLET | Freq: Every morning | ORAL | Status: DC
  Administered 2022-08-18 – 2022-08-21 (×4): 150 mg via ORAL
  Filled 2022-08-18 (×5): qty 1

## 2022-08-18 MED ORDER — DIPHENHYDRAMINE HCL 50 MG/ML IJ SOLN: 12.5000 mg | INTRAMUSCULAR | Status: DC | PRN

## 2022-08-18 MED ORDER — ZOLPIDEM TARTRATE 5 MG PO TABS: 5.0000 mg | ORAL_TABLET | Freq: Every evening | ORAL | Status: DC | PRN

## 2022-08-18 MED ORDER — KETOROLAC TROMETHAMINE 30 MG/ML IJ SOLN
30.0000 mg | Freq: Four times a day (QID) | INTRAMUSCULAR | Status: AC | PRN
  Administered 2022-08-18 – 2022-08-19 (×4): 30 mg via INTRAVENOUS

## 2022-08-18 MED ORDER — METOCLOPRAMIDE HCL 5 MG/ML IJ SOLN
INTRAMUSCULAR | Status: DC | PRN
  Administered 2022-08-18: 10 mg via INTRAVENOUS

## 2022-08-18 MED ORDER — MAGNESIUM HYDROXIDE 400 MG/5ML PO SUSP: 30.0000 mL | ORAL | Status: DC | PRN

## 2022-08-18 SURGICAL SUPPLY — 35 items
APL PRP STRL LF DISP 70% ISPRP (MISCELLANEOUS) ×2
APL SKNCLS STERI-STRIP NONHPOA (GAUZE/BANDAGES/DRESSINGS) ×1
BENZOIN TINCTURE PRP APPL 2/3 (GAUZE/BANDAGES/DRESSINGS) IMPLANT
CHLORAPREP W/TINT 26 (MISCELLANEOUS) ×2 IMPLANT
CLAMP UMBILICAL CORD (MISCELLANEOUS) ×1 IMPLANT
CLOTH BEACON ORANGE TIMEOUT ST (SAFETY) ×1 IMPLANT
DRSG OPSITE POSTOP 4X10 (GAUZE/BANDAGES/DRESSINGS) ×1 IMPLANT
ELECT REM PT RETURN 9FT ADLT (ELECTROSURGICAL) ×1
ELECTRODE REM PT RTRN 9FT ADLT (ELECTROSURGICAL) ×1 IMPLANT
EXTRACTOR VACUUM KIWI (MISCELLANEOUS) IMPLANT
EXTRACTOR VACUUM M CUP 4 TUBE (SUCTIONS) IMPLANT
GAUZE SPONGE 4X4 12PLY STRL LF (GAUZE/BANDAGES/DRESSINGS) IMPLANT
GLOVE BIOGEL PI IND STRL 7.0 (GLOVE) ×1 IMPLANT
GLOVE ORTHO TXT STRL SZ7.5 (GLOVE) ×1 IMPLANT
GOWN STRL REUS W/TWL LRG LVL3 (GOWN DISPOSABLE) ×2 IMPLANT
KIT ABG SYR 3ML LUER SLIP (SYRINGE) IMPLANT
MAT PREVALON FULL STRYKER (MISCELLANEOUS) IMPLANT
NDL HYPO 25X5/8 SAFETYGLIDE (NEEDLE) ×1 IMPLANT
NEEDLE HYPO 25X5/8 SAFETYGLIDE (NEEDLE) ×1 IMPLANT
NS IRRIG 1000ML POUR BTL (IV SOLUTION) ×1 IMPLANT
PACK C SECTION WH (CUSTOM PROCEDURE TRAY) ×1 IMPLANT
PAD OB MATERNITY 4.3X12.25 (PERSONAL CARE ITEMS) ×1 IMPLANT
RETRACTOR TRAXI PANNICULUS (MISCELLANEOUS) IMPLANT
RTRCTR C-SECT PINK 25CM LRG (MISCELLANEOUS) ×1 IMPLANT
STRIP CLOSURE SKIN 1/2X4 (GAUZE/BANDAGES/DRESSINGS) IMPLANT
SUT CHROMIC 1 CTX 36 (SUTURE) ×2 IMPLANT
SUT PLAIN 0 NONE (SUTURE) IMPLANT
SUT PLAIN 2 0 XLH (SUTURE) IMPLANT
SUT VIC AB 0 CT1 27 (SUTURE) ×2
SUT VIC AB 0 CT1 27XBRD ANBCTR (SUTURE) ×2 IMPLANT
SUT VIC AB 2-0 CT1 (SUTURE) ×1 IMPLANT
SUT VIC AB 4-0 KS 27 (SUTURE) IMPLANT
TOWEL OR 17X24 6PK STRL BLUE (TOWEL DISPOSABLE) ×1 IMPLANT
TRAY FOLEY W/BAG SLVR 14FR LF (SET/KITS/TRAYS/PACK) ×1 IMPLANT
WATER STERILE IRR 1000ML POUR (IV SOLUTION) ×1 IMPLANT

## 2022-08-18 NOTE — Anesthesia Postprocedure Evaluation (Signed)
Anesthesia Post Note  Patient: Carrie Blanchard  Procedure(s) Performed: CESAREAN SECTION (Abdomen)     Patient location during evaluation: PACU Anesthesia Type: Spinal Level of consciousness: awake and alert Pain management: pain level controlled Vital Signs Assessment: post-procedure vital signs reviewed and stable Respiratory status: spontaneous breathing and respiratory function stable Cardiovascular status: blood pressure returned to baseline and stable Postop Assessment: spinal receding and no apparent nausea or vomiting Anesthetic complications: no   No notable events documented.  Last Vitals:  Vitals:   08/18/22 1100 08/18/22 1121  BP:  133/81  Pulse: 71 75  Resp: 15 12  Temp:  36.8 C  SpO2:      Last Pain:  Vitals:   08/18/22 1100  TempSrc:   PainSc: 0-No pain   Pain Goal:    LLE Motor Response: Purposeful movement (08/18/22 1100) LLE Sensation: Decreased (08/18/22 1100) RLE Motor Response: Purposeful movement (08/18/22 1100) RLE Sensation: Decreased (08/18/22 1100) L Sensory Level: L4-Anterior knee, lower leg (08/18/22 1100) R Sensory Level: L4-Anterior knee, lower leg (08/18/22 1100) Epidural/Spinal Function Cutaneous sensation: Able to Discern Pressure (08/18/22 1100), Patient able to flex knees: Yes (08/18/22 1100), Patient able to lift hips off bed: No (08/18/22 1100), Back pain beyond tenderness at insertion site: No (08/18/22 1100), Progressively worsening motor and/or sensory loss: No (08/18/22 1100), Bowel and/or bladder incontinence post epidural: No (08/18/22 1100)  Beryle Lathe

## 2022-08-18 NOTE — Interval H&P Note (Signed)
History and Physical Interval Note:  08/18/2022 8:14 AM  Carrie Blanchard  has presented today for surgery, with the diagnosis of repeat c-section and uterine myomectomy.  The various methods of treatment have been discussed with the patient and family. After consideration of risks, benefits and other options for treatment, the patient has consented to  Procedure(s) with comments: CESAREAN SECTION (N/A) - request OB Fellow to assist as a surgical intervention.  The patient's history has been reviewed, patient examined, no change in status, stable for surgery.  I have reviewed the patient's chart and labs.  Questions were answered to the patient's satisfaction.     Leighton Roach Carrie Blanchard

## 2022-08-18 NOTE — Op Note (Signed)
Preoperative diagnosis: Intrauterine pregnancy at 37 weeks, previous myomectomy, IVF pregnancy, obesity, A2GDM Postoperative diagnosis: Same and breech presentation Procedure: Primary low transverse cesarean section without extensions Surgeon: Lavina Hamman M.D. Anesthesia: Spinal  Findings: Patient had normal gravid anatomy and delivered a viable female infant with Apgars of 6 and 9 weight pending Estimated blood loss: 470 cc Specimens: Placenta sent to labor and delivery Complications: None  Procedure in detail: The patient was taken to the operating room and placed in the sitting position. The anesthesiologist instilled spinal anesthesia.  She was then placed in the dorsosupine position with left tilt. Abdomen was then prepped and draped in the usual sterile fashion, and a foley catheter was inserted. The level of her anesthesia was found to be adequate. Abdomen was entered via a standard Pfannenstiel incision through her previous scar. Once the peritoneal cavity was entered, omental adhesions were taken down with Bovie and the Alexis disposable self-retaining retractor was placed and good visualization was achieved. A 4 cm transverse incision was then made in the lower uterine segment pushing the bladder inferior. Once the uterine cavity was entered the incision was extended digitally, clear amniotic fluid. The fetal breech was grasped and delivered through the incision, followed by legs, abdomen, and chest.  Arms were swept across the chest and delivered, and then the head delivered with minor difficulty. Mouth and nares were suctioned. Cord was doubly clamped and cut after one minute and the infant handed to the awaiting pediatric team. Cord blood was obtained. The placenta delivered spontaneously. Uterus was wiped dry with clean lap pad and all clots and debris were removed. Uterine incision was inspected and found to be free of extensions. Uterine incision was closed in 1 layer with running locking  #1 Chromic. Tubes and ovaries were inspected and found to be normal, some fundal omental adhesions were taken sown with Bovie. Uterine incision was inspected and found to be hemostatic. Bleeding from serosal edges was controlled with electrocautery. The Alexis retractor was removed. Subfascial space was irrigated and made hemostatic with electrocautery. Peritoneum was closed with 2-0 Vicryl.  Fascia was closed in running fashion starting at both ends and meeting in the middle with 0 Vicryl. Subcutaneous tissue was then irrigated and made hemostatic with electrocautery, then closed with running 2-0 plain gut. Skin was closed with running 4-0 Vicryl subcuticular suture followed by steri-strips and a sterile dressing. Patient tolerated the procedure well and was taken to the recovery in stable condition. Counts were correct x2, she received Ancef 3 g IV at the beginning of the procedure and she had PAS hose on throughout the procedure.

## 2022-08-18 NOTE — Anesthesia Procedure Notes (Signed)
Spinal  Patient location during procedure: OR Start time: 08/18/2022 8:32 AM End time: 08/18/2022 8:35 AM Reason for block: surgical anesthesia Staffing Performed: anesthesiologist  Anesthesiologist: Beryle Lathe, MD Performed by: Beryle Lathe, MD Authorized by: Beryle Lathe, MD   Preanesthetic Checklist Completed: patient identified, IV checked, risks and benefits discussed, surgical consent, monitors and equipment checked, pre-op evaluation and timeout performed Spinal Block Patient position: sitting Prep: DuraPrep Patient monitoring: heart rate, cardiac monitor, continuous pulse ox and blood pressure Approach: midline Location: L3-4 Injection technique: single-shot Needle Needle type: Pencan  Needle gauge: 24 G Additional Notes Consent was obtained prior to the procedure with all questions answered and concerns addressed. Risks including, but not limited to, bleeding, infection, nerve damage, paralysis, failed block, inadequate analgesia, allergic reaction, high spinal, itching, and headache were discussed and the patient wished to proceed. Functioning IV was confirmed and monitors were applied. Sterile prep and drape, including hand hygiene, mask, and sterile gloves were used. The patient was positioned and the spine was prepped. The skin was anesthetized with lidocaine. Free flow of clear CSF was obtained prior to injecting local anesthetic into the CSF. The spinal needle aspirated freely following injection. The needle was carefully withdrawn. The patient tolerated the procedure well.   Leslye Peer, MD

## 2022-08-18 NOTE — Transfer of Care (Signed)
Immediate Anesthesia Transfer of Care Note  Patient: Carrie Blanchard  Procedure(s) Performed: CESAREAN SECTION (Abdomen)  Patient Location: PACU  Anesthesia Type:Spinal  Level of Consciousness: awake, alert , and oriented  Airway & Oxygen Therapy: Patient Spontanous Breathing  Post-op Assessment: Report given to RN and Post -op Vital signs reviewed and stable  Post vital signs: Reviewed and stable  Last Vitals:  Vitals Value Taken Time  BP 127/75 08/18/22 1017  Temp 36.5 C 08/18/22 1017  Pulse 67 08/18/22 1025  Resp 20 08/18/22 1025  SpO2 94 % 08/18/22 1025  Vitals shown include unvalidated device data.  Last Pain:  Vitals:   08/18/22 1017  TempSrc:   PainSc: 0-No pain         Complications: No notable events documented.

## 2022-08-18 NOTE — Lactation Note (Signed)
This note was copied from a baby's chart. Lactation Consultation Note  Patient Name: Carrie Blanchard [id number] Date: 08/18/2022 Age:42 hours Reason for consult: Initial assessment;Primapara;1st time breastfeeding;Early term 37-38.6wks;Breastfeeding assistance;Maternal endocrine disorder  LC entered the room and the infant was asleep in the bassinet.  Per the birth parent the infant latched after birth and she has been able to hand express drops and feed them to him.  The birth parent stated that the infant was given formula due to his blood sugar dropping.  The birth parent said that she would like more assistance with hand expression.  [facility] taught the parents how to hand express and they were able to express drops.  LC reviewed information on supplementation guidelines after breastfeeding and if the infant does not latch.  LC encouraged the birth parent to wake the infant if it has been 3 hrs and he has not eaten.  LC reviewed information on infant behavior, infant stomach size, and the outpatient services brochure.  The parents had no further questions.  LC encouraged the birth parent to call for assistance with the latch at the next feeding.   Infant Feeding Plan:  Breastfeed 8+ times in 24 hours according to feeding cues.  Hand express and feed the expressed milk to the infant via a spoon.  Put the infant to the breast prior to supplementing.  Call RN/LC for assistance with breastfeeding.   Maternal Data Has patient been taught Hand Expression?: Yes Does the patient have breastfeeding experience prior to this delivery?: No  Feeding Mother's Current Feeding Choice: Breast Milk and Formula Nipple Type: Slow - flow  Interventions Interventions: Hand express;Breast massage;Education;LC Services brochure  Discharge Pump: DEBP;Personal  Consult Status Consult Status: Follow-up Date: 08/19/22 Follow-up type: In-patient   Carrie Blanchard 08/18/2022, 4:15 PM

## 2022-08-19 LAB — GLUCOSE, CAPILLARY
Glucose-Capillary: 75 mg/dL (ref 70–99)
Glucose-Capillary: 92 mg/dL (ref 70–99)
Glucose-Capillary: 102 mg/dL — ABNORMAL HIGH (ref 70–99)
Glucose-Capillary: 123 mg/dL — ABNORMAL HIGH (ref 70–99)

## 2022-08-19 LAB — CBC
HCT: 30.2 % — ABNORMAL LOW (ref 36.0–46.0)
Hemoglobin: 10 g/dL — ABNORMAL LOW (ref 12.0–15.0)
MCH: 30.5 pg (ref 26.0–34.0)
MCHC: 33.1 g/dL (ref 30.0–36.0)
MCV: 92.1 fL (ref 80.0–100.0)
Platelets: 254 10*3/uL (ref 150–400)
RBC: 3.28 MIL/uL — ABNORMAL LOW (ref 3.87–5.11)
RDW: 13.8 % (ref 11.5–15.5)
WBC: 12.9 10*3/uL — ABNORMAL HIGH (ref 4.0–10.5)
nRBC: 0 % (ref 0.0–0.2)

## 2022-08-19 LAB — BIRTH TISSUE RECOVERY COLLECTION (PLACENTA DONATION)

## 2022-08-19 MED ORDER — LEVOTHYROXINE SODIUM 50 MCG PO TABS
25.0000 ug | ORAL_TABLET | ORAL | Status: DC
  Administered 2022-08-20: 25 ug via ORAL
  Filled 2022-08-19 (×2): qty 1

## 2022-08-19 MED ORDER — LEVOTHYROXINE SODIUM 50 MCG PO TABS
50.0000 ug | ORAL_TABLET | ORAL | Status: DC
  Administered 2022-08-21: 50 ug via ORAL
  Filled 2022-08-19: qty 1

## 2022-08-19 NOTE — Progress Notes (Signed)
Subjective: Postpartum Day 1: Cesarean Delivery Patient reports incisional pain, tolerating PO, + flatus, and no problems voiding. Pain well controlled with meds. She denies CP, SOB or fever. Baby was sent to NICU this am due to blood glucose. Pt coping well; family present and supportive    Objective: Vital signs in last 24 hours: Temp:  [98 F (36.7 C)-98.4 F (36.9 C)] 98 F (36.7 C) (05/16 0114) Pulse Rate:  [56-75] 69 (05/16 0114) Resp:  [12-18] 18 (05/16 0114) BP: (108-133)/(63-81) 126/70 (05/16 0114) SpO2:  [98 %-100 %] 98 % (05/16 0114)  Physical Exam:  General: alert, cooperative, and no distress Lochia: appropriate Uterine Fundus: firm Incision: no significant drainage DVT Evaluation: No evidence of DVT seen on physical exam.  Recent Labs    08/19/22 0438  HGB 10.0*  HCT 30.2*    Assessment/Plan: Status post Cesarean section. Doing well postoperatively.  Continue current care Maternity belt ordered  OK to stop FSBS  Cathrine Muster, DO 08/19/2022, 10:54 AM

## 2022-08-19 NOTE — Lactation Note (Signed)
This note was copied from a baby's chart.  NICU Lactation Consultation Note  Patient Name: Boy Jewell Ambriz ZOXWR'U Date: 08/19/2022 Age:42 hours  Reason for consult: Follow-up assessment; Primapara; 1st time breastfeeding; NICU baby; Other (Comment); Early term 16-38.6wks; RN request; Maternal endocrine disorder (AMA, IVF) Type of Endocrine Disorder?: Diabetes; PCOS; Thyroid (A2GDM (metformin, insulin, hypothyroid))  SUBJECTIVE Visited with family of 72 hours old ETI NICU FEMALE; Ms. Rockwood is a P1 and reports she hasn't started pumping yet, NICU RN Inetta Fermo called this LC to follow up with her, baby was a transfer from the [facility] this morning. Set her up with a DEBP and instructed her how to take it apart and put it together to be taken to her room on the 5th when she's done with this pumping sessions. Assisted with hand expression and the initiation of pumping at 7 hours post-partum, baby is on Similac 24 calorie formula but her plan is to take baby to breast once he's ready; he's been sleepy, asked her to call for assistance when needed. Reviewed normal ETI behavior, feeding cues, pumping schedule, pump settings, lactogenesis II and anticipatory guidelines.  OBJECTIVE Infant data: Mother's Current Feeding Choice: Breast Milk and Formula  Infant feeding assessment Scale for Readiness: 2 Scale for Quality: 3   Maternal data: [id number]  C-Section, Low Transverse Has patient been taught Hand Expression?: Yes Hand Expression Comments: no colostrum noted at this time Significant Breast History:: moderate breast changes during the pregnancy Current breast feeding challenges:: NICU admission Does the patient have breastfeeding experience prior to this delivery?: No Pumping frequency: initiated pumping at 27 hours post-partum, this LC set her up with a DEBP Pumped volume: 0 mL Flange Size: 21 Risk factor for low milk supply:: primipara, A2GDM, PCOS, hypotrhyroid, 42 cc PPH, infant  separation  Pump: Personal (Spectra S2)  ASSESSMENT Infant: LATCH Documentation Latch: 1 Audible Swallowing: 1 Type of Nipple: 2 Comfort (Breast/Nipple): 2 Hold (Positioning): 1 LATCH Score: 7  Feeding Status: Scheduled 9-12-3-6  Maternal: Milk volume: Normal  INTERVENTIONS/PLAN Interventions: Interventions: Breast feeding basics reviewed; Breast massage; Hand express; DEBP; Education Tools: Pump; Flanges; Coconut oil Pump Education: Setup, frequency, and cleaning; Milk Storage  Plan: Encouraged to start pumping every 3 hours, ideally 8 pumping sessions/24 hours Breast massage and hand expression were also encouraged prior pumping She was also encouraged to take baby to breast on feeding cues around feeding times and to call for assistance PRN  No other support person at this time. All questions and concerns answered, family to contact [facility] services PRN.  Consult Status: NICU follow-up NICU Follow-up type: Maternal D/C visit   J[place]ica Inthavong Venetia Constable 08/19/2022, 1:36 PM

## 2022-08-19 NOTE — Progress Notes (Signed)
Provider notified of pt glucose level, diabetes consult order put in, no new nursing orders, care ongoing.

## 2022-08-19 NOTE — Progress Notes (Signed)
Patient screened out for psychosocial assessment since none of the following apply: Psychosocial stressors documented in mother or baby's chart Gestation less than 32 weeks Code at delivery  Infant with anomalies Please contact the Clinical Social Worker if specific needs arise, by MOB's request, or if MOB scores greater than 9/yes to question 10 on Edinburgh Postpartum Depression Screen.  MOB was referred for history of depression/anxiety. * Referral screened out by Clinical Social Worker because none of the following criteria appear to apply: ~ History of anxiety/depression during this pregnancy, or of post-partum depression following prior delivery. ~ Diagnosis of anxiety and/or depression within last 3 years OR * MOB's symptoms currently being treated with medication and/or therapy. Per chart review, MOB is currently prescribed/taking Wellbutrin and Buspar.   Please contact the Clinical Social Worker if needs arise, by MOB request, or if MOB scores greater than 9/yes to question 10 on Edinburgh Postpartum Depression Screen.  Keairra Bardon, LCSW Clinical Social Worker [facility] Cell#: [phone] 

## 2022-08-20 ENCOUNTER — Encounter (HOSPITAL_COMMUNITY): Payer: Self-pay | Admitting: Obstetrics and Gynecology

## 2022-08-20 NOTE — Lactation Note (Signed)
This note was copied from a baby's chart.  NICU Lactation Consultation Note  Patient Name: Carrie Blanchard RUEAV'W Date: 08/20/2022 Age:42 hours  Reason for consult: Follow-up assessment; Primapara; 1st time breastfeeding; NICU baby; Other (Comment); Early term 89-38.6wks (AMA, IVF) Type of Endocrine Disorder?: Diabetes; PCOS; Thyroid (A2GDM (metformin, insulin), hypothyroid)  SUBJECTIVE Visited with family of 13 hours old ETI NICU female; Carrie Blanchard is a P1 and reports she's been pumping consistently during the day but forgot to do it at night, she was exhausted. Explained the importance of consistent pumping for the onset of lactogenesis II and the prevention of engorgement. She voiced she's not getting anything when she pumps, assisted with breast massage and hand expression (no colostrum noted yet) and advised her to try this prior pumping. Baby "[name]" is taking full volumes, he took a 35 ml bottle with Simlac 24 calorie formula. Reassured parents that we'll continue doing full volume feeds through gavage/bottle once baby goes to breast and that anything he'll get at the breast will be just "extra" until maternal supply is in. Carrie Blanchard is expecting to be discharged tomorrow. Reviewed discharge education, engorgement prevention/treatment, pump settings, lactogenesis II/III and anticipatory guidelines.   OBJECTIVE Infant data: Mother's Current Feeding Choice: Breast Milk and Formula  Infant feeding assessment Scale for Readiness: 1 Scale for Quality: 2   Maternal data: [id number]  C-Section, Low Transverse Has patient been taught Hand Expression?: Yes Hand Expression Comments: no colostrum noted at this time Significant Breast History:: moderate breast changes during the pregnancy Current breast feeding challenges:: NICU admission Does the patient have breastfeeding experience prior to this delivery?: No Pumping frequency: 6 times/24 hours Pumped volume: 0 mL (droplets, but only in one  pumping, zero in the others) Flange Size: 21 Risk factor for low milk supply:: primipara, A2GDM, PCOS, hypotrhyroid, 463 cc PPH, infant separation  Pump: Personal (Spectra S2)  ASSESSMENT Infant: LATCH Documentation Latch: 0 Audible Swallowing: 0 Type of Nipple: 2 Comfort (Breast/Nipple): 2 Hold (Positioning): 1 LATCH Score: 5  Feeding Status: Scheduled 8-11-2-5  Maternal: Milk volume: Normal  INTERVENTIONS/PLAN Interventions: Interventions: Breast feeding basics reviewed; Breast massage; Hand express; DEBP; Education Discharge Education: Engorgement and breast care Tools: Pump; Flanges; Coconut oil Pump Education: Setup, frequency, and cleaning; Milk Storage  Plan: Encouraged to start pumping every 3 hours day and night, ideally 8 pumping sessions/24 hours Breast massage and hand expression were also encouraged prior pumping She'll take all pump parts to baby's room after her discharge She'll switch her pump settings from initiation to maintenance mode once she starts getting 20 ml of EBM combined She was also encouraged to take baby to breast on feeding cues around feeding times and to call for assistance PRN   FOB present and supportive. All questions and concerns answered, family to contact [facility] services PRN.  Consult Status: NICU follow-up NICU Follow-up type: Maternal D/C visit; Verify onset of copious milk; Verify absence of engorgement   Carrie Blanchard S Izaya Netherton 08/20/2022, 1:04 PM

## 2022-08-20 NOTE — Progress Notes (Signed)
Went to reassess pain and BP, pt was not in the room

## 2022-08-20 NOTE — Progress Notes (Signed)
Patient ID: Carrie Blanchard, female   DOB: 1981-01-28, 42 y.o.   MRN: [id number]  Patient currently not in room likely in NICU

## 2022-08-21 MED ORDER — OXYCODONE-ACETAMINOPHEN 5-325 MG PO TABS: 1.0000 | ORAL_TABLET | ORAL | 0 refills | Status: AC | PRN

## 2022-08-21 MED ORDER — IBUPROFEN 600 MG PO TABS: 600.0000 mg | ORAL_TABLET | Freq: Four times a day (QID) | ORAL | 1 refills | Status: AC | PRN

## 2022-08-21 NOTE — Progress Notes (Signed)
Subjective: Postpartum Day 3: Cesarean Delivery Patient reports incisional pain, tolerating PO, + flatus, + BM, and no problems voiding.    Objective: Vital signs in last 24 hours: Temp:  [98.6 F (37 C)] 98.6 F (37 C) (05/17 1612) Pulse Rate:  [72-84] 72 (05/18 0500) Resp:  [16-18] 18 (05/18 0500) BP: (127-131)/(66-81) 129/69 (05/18 0500) SpO2:  [95 %-100 %] 96 % (05/18 0500)  Physical Exam:  General: alert, cooperative, and no distress Lochia: appropriate Uterine Fundus: firm Incision: no significant drainage DVT Evaluation: No evidence of DVT seen on physical exam.  Recent Labs    08/19/22 0438  HGB 10.0*  HCT 30.2*    Assessment/Plan: Status post Cesarean section. Doing well postoperatively.  Discharge home with standard precautions and return to clinic in 2 weeks for incision check and 6 weeks for postpartum care  Cathrine Muster, DO 08/21/2022, 10:35 AM

## 2022-08-21 NOTE — Discharge Summary (Signed)
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 yo G5P1041 at [redacted]w[redacted]d was admitted to the hospital 08/18/2022 for scheduled cesarean section with the following indication:Prior Uterine Surgery.Delivery details are as follows:  Membrane Rupture Time/Date: 9:04 AM ,08/18/2022   Delivery Method:C-Section, Low Transverse  Details of operation can be found in separate operative note.  Patient had a postpartum course complicated byn/a.  She is ambulating, tolerating a regular diet, passing flatus, and urinating well. Patient is discharged home in stable condition on  08/21/22        Newborn Data: Birth date:08/18/2022  Birth time:9:06 AM  Gender:Female  Living status:Living  Apgars:6 ,9  Weight:3540 g     Magnesium Sulfate received: No BMZ received: No   Physical exam  Vitals:   08/20/22 0945 08/20/22 1612 08/20/22 1920 08/21/22 0500  BP: 123/69 127/66 131/81 129/69  Pulse:  84  72  Resp:  18 16 18   Temp:  98.6 F (37 C)    TempSrc:  Oral    SpO2:  100% 95% 96%  Weight:      Height:       General: alert, cooperative, and no distress Lochia: appropriate Uterine Fundus: firm Incision: Dressing is clean, dry, and intact DVT Evaluation: No evidence of DVT  seen on physical exam. Labs: Lab Results  Component Value Date   WBC 12.9 (H) 08/19/2022   HGB 10.0 (L) 08/19/2022   HCT 30.2 (L) 08/19/2022   MCV 92.1 08/19/2022   PLT 254 08/19/2022      Latest Ref Rng & Units 08/16/2022   10:00 AM  CMP  Glucose 70 - 99 mg/dL 99   BUN 6 - 20 mg/dL 9   Creatinine 7.82 - 9.56 mg/dL 2.13   Sodium 086 - 578 mmol/L 136   Potassium 3.5 - 5.1 mmol/L 3.7   Chloride 98 - 111 mmol/L 103   CO2 22 - 32 mmol/L 20   Calcium 8.9 - 10.3 mg/dL 8.4   Total Protein 6.5 - 8.1 g/dL 6.0   Total Bilirubin 0.3 - 1.2 mg/dL 0.3   Alkaline Phos 38 - 126 U/L 143   AST 15 - 41 U/L 19   ALT 0 - 44 U/L 14    Edinburgh Score:    08/18/2022   11:30 AM  Edinburgh Postnatal Depression Scale Screening Tool  I have been able to laugh and see the funny side of things. 0  I have looked forward with enjoyment to things. 0  I have blamed myself unnecessarily when things went wrong. 1  I have been anxious or worried for no good  reason. 0  I have felt scared or panicky for no good reason. 1  Things have been getting on top of me. 1  I have been so unhappy that I have had difficulty sleeping. 0  I have felt sad or miserable. 0  I have been so unhappy that I have been crying. 1  The thought of harming myself has occurred to me. 0  Edinburgh Postnatal Depression Scale Total 4      After visit meds:  Allergies as of 08/21/2022   No Known Allergies      Medication List     STOP taking these medications    aspirin EC 81 MG tablet   insulin NPH Human 100 UNIT/ML injection Commonly known as: NOVOLIN N   metFORMIN 500 MG tablet Commonly known as: GLUCOPHAGE       TAKE these medications    acetaminophen 500 MG tablet Commonly known as: TYLENOL Take 500-1,000 mg by mouth every 6 (six) hours as needed (pain.).   buPROPion 150 MG 24 hr tablet Commonly known as: WELLBUTRIN XL Take 150 mg by mouth in the morning.   busPIRone 5 MG tablet Commonly known as:  BUSPAR Take 1 tablet (5 mg total) by mouth 3 (three) times daily. Pt takes BID. What changed:  when to take this additional instructions   calcium carbonate 500 MG chewable tablet Commonly known as: TUMS - dosed in mg elemental calcium Chew 1-2 tablets by mouth 3 (three) times daily as needed for indigestion or heartburn.   clotrimazole-betamethasone cream Commonly known as: LOTRISONE Apply 1 Application topically daily.   fluticasone 50 MCG/ACT nasal spray Commonly known as: FLONASE Place 1-2 sprays into both nostrils daily as needed (allergy/cold-type symptoms).   guaiFENesin 600 MG 12 hr tablet Commonly known as: MUCINEX Take 600-1,200 mg by mouth 2 (two) times daily as needed (allergy/cold-type symptoms).   ibuprofen 600 MG tablet Commonly known as: ADVIL Take 1 tablet (600 mg total) by mouth every 6 (six) hours as needed for moderate pain or cramping.   INOSITOL PO Take 200 mg by mouth in the morning and at bedtime.   levothyroxine 25 MCG tablet Commonly known as: SYNTHROID TAKE 1 TABLET BY MOUTH DAILY MONDAY THROUGH FRIDAY AND 2 TABLETS SATURDAY AND SUNDAY   multivitamin-prenatal 27-0.8 MG Tabs tablet Take 1 tablet by mouth in the morning.   oxyCODONE-acetaminophen 5-325 MG tablet Commonly known as: Percocet Take 1 tablet by mouth every 4 (four) hours as needed for up to 7 days.   VITAMIN D-3 PO Take 1 tablet by mouth in the morning.   ZINC PO Take 1 tablet by mouth in the morning.               Discharge Care Instructions  (From admission, onward)           Start     Ordered   08/21/22 0000  Discharge wound care:       Comments: May shower, pat incision dry. Keep clean   08/21/22 1040             Discharge home in stable condition Infant Feeding: Bottle and Breast Infant Disposition:NICU Discharge instruction: per After Visit Summary and Postpartum booklet. Activity: Advance as tolerated. Pelvic rest for 6 weeks.  Diet: carb  modified diet Anticipated Birth Control: Unsure Postpartum Appointment:6 weeks Additional Postpartum F/U: Postpartum Depression checkup and 2 weeks  Future Appointments:No future appointments. Follow up Visit:  Follow-up Information     [facility].  Schedule an appointment as soon as possible for a visit today.   Why: 2 weeks for incision check and 6weeks for postpartum visit Contact information: [address]  [address] [place] [address] [phone]                     08/21/2022 Cathrine Muster, DO

## 2022-08-21 NOTE — Discharge Instructions (Signed)
Call office with any concerns [phone] 

## 2022-08-23 ENCOUNTER — Ambulatory Visit (HOSPITAL_COMMUNITY): Payer: Self-pay

## 2022-08-23 NOTE — Lactation Note (Signed)
This note was copied from a baby's chart.  NICU Lactation Consultation Note  Patient Name: Carrie Blanchard Date: 08/23/2022 Age:42 days  Reason for consult: Follow-up assessment; Primapara; Maternal endocrine disorder; NICU baby; Early term 54-38.6wks; Breastfeeding assistance Type of Endocrine Disorder?: PCOS; Thyroid; Diabetes  SUBJECTIVE  LC in to assist with positioning and latching baby to the breast.  Mom requested assistance for next feeding as baby is ad lib.  Baby showing feeding cues.  Assisted Mom to hand express a drop of milk onto nipple.    Baby positioned in cross cradle hold,  LC sandwiching areola to help baby attain a deeper latch to the breast.  Baby unable to sustain the latch.  After a few tries, LC initiated a 16 mm nipple shield (showing Mom how to invert first and stretch over nipple to draw the nipple into the shield).  Baby showed more interest and latched to nipple tip only, sucking with non-nutritive sucking.  Baby needing a lot of stimulation to continue sucking.  After talking with Mom about her delayed onset of milk, or possible low milk supply due to her risk factors, LC initiated a 5 fr feeding tube and syringe and while baby latched, threaded the feeding tube into the side of baby's mouth.  Baby able to create suction and suck with a wider and deeper latch onto areola.  Lots of teaching done during this feeding.  Baby fed for 5 more mins with suck/swallows and deep jaw extensions as he was pulling the formula supplementation in.  Baby ingested 4 ml of formula and seemed contented.  Mom paced bottle fed while baby lying on his side across Mom's breast.  Baby's mouth was opened wider and he was latched deeper on the bottle nipple.  LC created a hands free pumping band and encouraged Mom to double pump after feeding to support her milk supply  . OBJECTIVE Infant data: No data recorded Infant feeding assessment Scale for Readiness: 1 Scale for  Quality: 2   Maternal data: G5P1041  C-Section, Low Transverse Pumping frequency: Every 3 hrs Pumped volume: 3 mL Flange Size: 21  Pump: Personal (Spectra S2)  ASSESSMENT Infant: LATCH Documentation Latch: 1 Audible Swallowing: 1 (with supplement at the breast only) Type of Nipple: 2 (short nipple shafts) Comfort (Breast/Nipple): 2 Hold (Positioning): 1 LATCH Score: 7  Feeding Status: Ad lib  Maternal: Milk volume: Low  INTERVENTIONS/PLAN Interventions: Interventions: Breast feeding basics reviewed; Assisted with latch; Skin to skin; Breast massage; Hand express; Breast compression; Adjust position; Position options; Support pillows; Expressed milk; DEBP; Education; Pace feeding Tools: Pump; Flanges; Nipple Shields; Hands-free pumping top; Bottle Pump Education: Setup, frequency, and cleaning Nipple shield size: 16  Plan: Consult Status: NICU follow-up NICU Follow-up type: Assist with IDF-2 (Mother does not need to pre-pump before breastfeeding)   Carrie, Blanchard 08/23/2022, 4:51 PM

## 2022-08-23 NOTE — Lactation Note (Signed)
This note was copied from a baby's chart.  NICU Lactation Consultation Note  Patient Name: Carrie Blanchard ZOXWR'U Date: 08/23/2022 Age:42 days  Reason for consult: Follow-up assessment; RN request; Maternal endocrine disorder; Primapara; 1st time breastfeeding; NICU baby; Early term 37-38.6wks Type of Endocrine Disorder?: Diabetes; Thyroid; PCOS (IVF pregnancy, AMA)  SUBJECTIVE  LC in to visit with P1 Mom of ET infant that was transferred to NICU for low blood sugars.  Baby has been fed by NG tube and now taking full volumes by bottle.  Baby was just made ad lib.  Mom resting and saying she just expressed a small amount of milk and it was in the frig.  About 2 ml of EBM noted in frig.  Mom aware of of her risk factors for low milk supply.  Mom supported and encouraged in her consistent pumping and hand expression.  Assisted with STS while Mom in the recliner.  Baby showing feeding cues.  Assisted with cross cradle hold, baby opening to latch, taking a couple sucks and then stops.  Reassured parents that this was normal.  Hand expression reveals drops of milk and baby cues to latch.  Baby was fed 44 ml formula 1.5 hrs prior.  Teaching on positioning and breast support needed to facilitate a deep latch to the breast.  Left baby STS in cross cradle hold licking, sucking, learning at the breast.  RN aware.  Reviewed importance of disassembling pump parts to wash, rinse and air dry.  Assisted with this.  Mom to pump after doing STS with baby "[name]".  RN to call when baby starts showing more vigorous feeding cues.  OBJECTIVE Infant data: No data recorded Infant feeding assessment Scale for Readiness: 2 Scale for Quality: 2   Maternal data: G5P1041  C-Section, Low Transverse Pumping frequency: Every 3 hrs consistently Pumped volume: 3 mL Flange Size: 21  Pump: Personal (Spectra S2)  ASSESSMENT Infant: LATCH Documentation Latch: 1 Audible Swallowing: 0 Type of Nipple:  2 Comfort (Breast/Nipple): 2 Hold (Positioning): 1 LATCH Score: 6  Feeding Status: Ad lib  Maternal: Milk volume: Low  INTERVENTIONS/PLAN Interventions: Interventions: Breast feeding basics reviewed; Assisted with latch; Skin to skin; Breast massage; Hand express; Breast compression; Adjust position; Support pillows; Position options; DEBP; Coconut oil; Education Tools: Pump; Flanges; Bottle Pump Education: Setup, frequency, and cleaning; Milk Storage  Plan: Consult Status: NICU follow-up NICU Follow-up type: Assist with IDF-2 (Mother does not need to pre-pump before breastfeeding)   [place], [place]zon 08/23/2022, 11:01 AM

## 2022-08-24 ENCOUNTER — Ambulatory Visit (HOSPITAL_COMMUNITY): Payer: Self-pay

## 2022-08-24 NOTE — Lactation Note (Signed)
This note was copied from a baby's chart.  NICU Lactation Consultation Note  Patient Name: Carrie Blanchard Date: 08/24/2022 Age:42 days  Reason for consult: Follow-up assessment; Primapara; 1st time breastfeeding; Breastfeeding assistance; RN request; Early term 37-38.6wks Type of Endocrine Disorder?: Diabetes; Thyroid; PCOS  SUBJECTIVE  RN called for breastfeeding assistance.  By the time [facility] got there, baby was receiving a bottle.  Baby wouldn't latch and became fussy.    Mom encouraged to do STS and pump on maintenance setting (20-30 mins) now, after each feeding.    RN to call at next feeding for [facility] assist.  OBJECTIVE  Infant feeding assessment Scale for Readiness: 1 Scale for Quality: 2   Maternal data: [id number]  C-Section, Low Transverse Pumping frequency: every 3 hrs during the day, not at night Pumped volume: 5 mL Flange Size: 21  Pump: Personal (Spectra S2)  ASSESSMENT Infant: LATCH Documentation Latch: 1 Audible Swallowing: 1 (with supplement at the breast only) Type of Nipple: 2 (short nipple shafts) Comfort (Breast/Nipple): 2 Hold (Positioning): 1 LATCH Score: 7  Feeding Status: Ad lib  Maternal: Milk volume: Low  INTERVENTIONS/PLAN Interventions: Interventions: Breast feeding basics reviewed; Skin to skin; Breast massage; Hand express; DEBP; Coconut oil; Pace feeding; Education Tools: Pump; Flanges; Hands-free pumping top; Bottle; Nipple Shields Pump Education: Setup, frequency, and cleaning; Milk Storage Nipple shield size: 16  Plan: Consult Status: NICU follow-up NICU Follow-up type: Verify onset of copious milk; Verify absence of engorgement   Carrie Blanchard, Keelin 08/24/2022, 11:55 AM

## 2022-08-24 NOTE — Lactation Note (Signed)
This note was copied from a baby's chart.  NICU Lactation Consultation Note  Patient Name: Carrie Blanchard [id number] Date: 08/24/2022 Age:42 days  Reason for consult: Follow-up assessment; NICU baby; Early term 35-38.6wks; Maternal endocrine disorder; Breastfeeding assistance; Mother's request; RN request Type of Endocrine Disorder?: PCOS; Diabetes; Thyroid  SUBJECTIVE  LC in to assist with another breastfeeding.  Baby is ad lib and showing some feeding cues.   Mom in recliner leaned back.  Baby placed STS on Mom's chest in prone position.  LC assisted baby to latch in laid back hold.  Once baby was showing vigorous interest, LC sat Mom more upright and placed a pillow under baby.  Assisted Mom to use cross cradle hold to support and sandwich breast to help baby attain and sustain a deeper latch. Reviewed hand expression and milk sprayed across Mom's lap.  Mom feels that her breasts are filling and tender to touch.    LC initiated a 16 mm nipple shield, showing Mom how to properly apply (invert halfway, stretch over nipple before flipping it around to pull nipple into shield).  Baby latched, and sat there on the breast, contented and alert staring at his Mom.  Mom was wonderful as she gently encouraged "[name]" to relax and suck.  He did and his mouth opened up wider onto the areola.  LC was able to teach Mom what non-nutritive and nutritive sucking looks like.  Baby was not consistently nutritive, but more vigorous on the breast than yesterday.  Swallowing identified occasionally.  After 20 mins on the breast, when baby came off, Mom's nipple was pulled well into shield and milk drops noted to be in the shield.  Mom was thrilled.  Handouts on ways to increase milk supply and galactogogue use provided.  OBJECTIVE Infant feeding assessment Scale for Readiness: 1 Scale for Quality: 2   Maternal data: [id number]  C-Section, Low Transverse Pumping frequency: Every 3 hrs/8 times per 24 hrs Pumped  volume: 5 mL Flange Size: 21  Pump: Personal (Spectra S2)  ASSESSMENT Infant: LATCH Documentation Latch: 1 Audible Swallowing: 1 Type of Nipple: 2 (short nipples, compressible areola) Comfort (Breast/Nipple): 1 (breasts are tender) Hold (Positioning): 1 LATCH Score: 6  Feeding Status: Ad lib  Maternal: Milk volume: Low  INTERVENTIONS/PLAN Interventions: Interventions: Breast feeding basics reviewed; Assisted with latch; Skin to skin; Breast massage; Hand express; Support pillows; Adjust position; Breast compression; Position options; DEBP; Education; Pace feeding Tools: Nipple Shields; Pump; Flanges; Hands-free pumping top; Bottle Pump Education: Setup, frequency, and cleaning; Milk Storage Nipple shield size: 16  Plan: Mom encouraged to pump both breasts on maintenance setting for 20-30 mins after every breast feeding and every bottle feeding.  Consult Status: NICU follow-up NICU Follow-up type: Assist with IDF-2 (Mother does not need to pre-pump before breastfeeding)   [place], [place] 08/24/2022, 4:39 PM

## 2022-08-25 ENCOUNTER — Ambulatory Visit (HOSPITAL_COMMUNITY): Payer: Self-pay

## 2022-08-25 NOTE — Lactation Note (Signed)
This note was copied from a baby's chart.  NICU Lactation Consultation Note  Patient Name: Carrie Blanchard Date: 08/25/2022 Age:42 years  Reason for consult: Follow-up assessment; NICU baby; Maternal endocrine disorder; Other (Comment); Primapara; 1st time breastfeeding; Early term 37-38.6wks (AMA. IVF) Type of Endocrine Disorder?: PCOS; Diabetes (A2GDM (metformin, insulin), hypothyroid)  SUBJECTIVE Visited with family of 42 days old ETI NICU female; Carrie Blanchard is a P1 and reports she's been taking baby to breast, she tried once yesterday with [facility] Carrie Blanchard and "it was the longest Carrie Blanchard ever latched". Parents are taking baby "Carrie Blanchard" home today. Reviewed discharge education, lactogenesis III, and the importance of continuing pumping after feedings/attempts at the breast to protect her supply. Discussed strategies to increase supply. She's already using the maintenance settings on her pump, but her supply has not come in yet, but she's pumping consistently. Referral to Carrie Blanchard. sent per parental request, Carrie Blanchard voiced "she wished she could take [place] home with them", parents were very appreciative. Carrie Blanchard present and very supportive. All questions and concerns answered, family to contact [facility] services PRN.  OBJECTIVE Infant data: Mother's Current Feeding Choice: Breast Milk and Formula  Infant feeding assessment Scale for Readiness: 1 Scale for Quality: 2   Maternal data: [id number]  C-Section, Low Transverse Pumping frequency: 6-8 times/24 hours Pumped volume: 6 mL Flange Size: 21  Pump: Personal (Spectra S2)  ASSESSMENT Infant: LATCH Documentation Latch: 1 Audible Swallowing: 1 Type of Nipple: 2 (short nipples, compressible areola) Comfort (Breast/Nipple): 1 (breasts are tender) Hold (Positioning): 1 LATCH Score: 6  Feeding Status: Ad lib  Maternal: Milk volume: Low  INTERVENTIONS/PLAN Interventions: Interventions: Breast feeding basics reviewed; DEBP; Education Discharge  Education: Outpatient recommendation; Outpatient Epic message sent Tools: Pump; Flanges Pump Education: Setup, frequency, and cleaning; Milk Storage Nipple shield size: 16  Plan: Consult Status: Complete NICU Follow-up type: Assist with IDF-2 (Mother does not need to pre-pump before breastfeeding)   Becky Berberian S Crystalyn Delia 08/25/2022, 4:12 PM

## 2022-08-28 ENCOUNTER — Telehealth (HOSPITAL_COMMUNITY): Payer: Self-pay

## 2022-08-28 NOTE — Telephone Encounter (Signed)
Patient did not answer phone call. Voicemail left for patient.   Suann Larry [facility]   08/28/22,0918

## 2022-08-28 NOTE — Telephone Encounter (Signed)
Patient reports that she is doing well. Patient states that her C/S incision has some redness and bloody drainage coming from the right side. She states that the incision has a foul odor. She reports that she is washing the incision daily with soapy water. Patient has not had a fever. RN reviewed signs of infection with patient. RN told patient to contact her OB after our phone call and speak with them about her concerns. RN told patient about [facility]. Patient declines any other questions/concerns about her health and healing.  Patient reports that baby is doing well. Patient states that baby is eating well and gaining weight. Baby sleeps in a bassinet. RN reviewed ABC's of safe sleep with patient. Patient declines any questions or concerns about baby.  EPDS score is 4.  Suann Larry [facility]  Perinatal Services   08/28/22,1232
